# Patient Record
Sex: Male | Born: 1980
Health system: Southern US, Community
[De-identification: ages and names within clinical notes are randomized; demographics above are authoritative.]

## PROBLEM LIST (undated history)

## (undated) DIAGNOSIS — J45909 Unspecified asthma, uncomplicated: Secondary | ICD-10-CM

## (undated) DIAGNOSIS — M1990 Unspecified osteoarthritis, unspecified site: Secondary | ICD-10-CM

## (undated) DIAGNOSIS — T7840XA Allergy, unspecified, initial encounter: Secondary | ICD-10-CM

## (undated) HISTORY — PX: HEMORRHOID SURGERY: SHX153

## (undated) HISTORY — PX: BAND HEMORRHOIDECTOMY: SHX1213

## (undated) HISTORY — DX: Allergy, unspecified, initial encounter: T78.40XA

## (undated) HISTORY — DX: Unspecified asthma, uncomplicated: J45.909

## (undated) HISTORY — DX: Unspecified osteoarthritis, unspecified site: M19.90

## (undated) HISTORY — PX: SPINE SURGERY: SHX786

## (undated) HISTORY — PX: PILONIDAL CYST EXCISION: SHX744

---

## 2003-05-11 HISTORY — PX: REPLACEMENT DISC ANTERIOR LUMBAR SPINE: SUR1215

## 2004-06-06 ENCOUNTER — Emergency Department: Payer: Self-pay | Admitting: Emergency Medicine

## 2009-09-07 ENCOUNTER — Emergency Department: Payer: Self-pay | Admitting: Emergency Medicine

## 2013-08-23 ENCOUNTER — Ambulatory Visit: Payer: Self-pay | Admitting: Family Medicine

## 2016-02-04 ENCOUNTER — Ambulatory Visit (INDEPENDENT_AMBULATORY_CARE_PROVIDER_SITE_OTHER): Payer: 59 | Admitting: Family Medicine

## 2016-02-04 ENCOUNTER — Encounter: Payer: Self-pay | Admitting: Family Medicine

## 2016-02-04 VITALS — BP 134/82 | HR 83 | Temp 98.0°F | Resp 16 | Ht 70.0 in | Wt 213.6 lb

## 2016-02-04 DIAGNOSIS — T148XXA Other injury of unspecified body region, initial encounter: Secondary | ICD-10-CM

## 2016-02-04 DIAGNOSIS — J4520 Mild intermittent asthma, uncomplicated: Secondary | ICD-10-CM

## 2016-02-04 DIAGNOSIS — J302 Other seasonal allergic rhinitis: Secondary | ICD-10-CM | POA: Diagnosis not present

## 2016-02-04 DIAGNOSIS — T148 Other injury of unspecified body region: Secondary | ICD-10-CM

## 2016-02-04 DIAGNOSIS — Z91018 Allergy to other foods: Secondary | ICD-10-CM | POA: Insufficient documentation

## 2016-02-04 MED ORDER — ALBUTEROL SULFATE HFA 108 (90 BASE) MCG/ACT IN AERS: 2.0000 | INHALATION_SPRAY | Freq: Four times a day (QID) | RESPIRATORY_TRACT | 0 refills | Status: DC | PRN

## 2016-02-04 MED ORDER — LORATADINE 10 MG PO TABS: 10.0000 mg | ORAL_TABLET | Freq: Every day | ORAL | 11 refills | Status: DC

## 2016-02-04 NOTE — Progress Notes (Signed)
Name: Aaron Atkins   MRN: [id number]    DOB: 04-29-1981   Date:02/04/2016       Progress Note  Subjective  Chief Complaint  Chief Complaint  Patient presents with  . Fall    Patient was in [place] on January 30, 2016 and was walking down some steps that were wet. He fell but caught him him self and now has developed a big contusions on his right buttock and thigh. Patient states it hurts when sleeping and sitting for periods of time and throbbs.     HPI  Asthma mild intermittent: doing well, used Proair last a few weeks ago because he was hiking, otherwise doing well, no wheezing or cough. Seems to be triggered by exercise and increase in humidity  AR: he is doing well at this time, worse symptoms in Spring and mid - Fall, takes otc loratadine to control symptoms. No sneezing, rhinorrhea or nasal congestion  Fall/hematoma: he fell while on vacation in [place] 6 days ago, he fell down some slippery steps and hit his right buttocks and outer hip on concrete. He developed some pain but states that he is worried because hematoma seems to be getting larger instead of smaller, pain is controlled with Aleve, but pain is not getting progressively worse. He never hit his head. He denies change in bowel movements, hematuria or dysuria   Patient Active Problem List   Diagnosis Date Noted  . Nut allergy 02/04/2016  . Asthma, mild intermittent 02/04/2016  . Seasonal allergies 02/04/2016    Past Surgical History:  Procedure Laterality Date  . PILONIDAL CYST EXCISION  2002-2004  . REPLACEMENT DISC ANTERIOR LUMBAR SPINE  2005   Charte Disc Replacement  . SPINE SURGERY      Family History  Problem Relation Age of Onset  . Asthma Mother   . Diabetes Maternal Grandfather   . Stroke Maternal Grandfather   . Arthritis Maternal Grandfather     Social History   Social History  . Marital status: Single    Spouse name: N/A  . Number of children: N/A  . Years of education: N/A   Occupational  History  . Not on file.   Social History Main Topics  . Smoking status: Never Smoker  . Smokeless tobacco: Never Used  . Alcohol use Yes     Comment: ocassionally  . Drug use: No  . Sexual activity: No   Other Topics Concern  . Not on file   Social History Narrative  . No narrative on file     Current Outpatient Prescriptions:  .  EPINEPHrine 0.3 mg/0.3 mL IJ SOAJ injection, Inject 0.3 mg into the muscle once., Disp: , Rfl:  .  albuterol (PROAIR HFA) 108 (90 Base) MCG/ACT inhaler, Inhale 2 puffs into the lungs every 6 (six) hours as needed for wheezing or shortness of breath., Disp: 1 Inhaler, Rfl: 0 .  loratadine (CLARITIN) 10 MG tablet, Take 1 tablet (10 mg total) by mouth daily., Disp: 30 tablet, Rfl: 11  Allergies  Allergen Reactions  . Latex Itching and Swelling  . Peanut-Containing Drug Products Hives  . Tree Extract Hives  . Shellfish Allergy Rash     ROS  Ten systems reviewed and is negative except as mentioned in HPI   Objective  Vitals:   02/04/16 1028  BP: 134/82  Pulse: 83  Resp: 16  Temp: 98 F (36.7 C)  TempSrc: Oral  SpO2: 98%  Weight: 213 lb 9.6 oz (96.9 kg)  Height:  5\' 10"  (1.778 m)    Body mass index is 30.65 kg/m.  Physical Exam  Constitutional: Patient appears well-developed and well-nourished.No distress.  HEENT: head atraumatic, normocephalic, pupils equal and reactive to light, , neck supple, throat within normal limits Cardiovascular: Normal rate, regular rhythm and normal heart sounds.  No murmur heard. No BLE edema. Pulmonary/Chest: Effort normal and breath sounds normal. No respiratory distress. Abdominal: Soft.  There is no tenderness. Psychiatric: Patient has a normal mood and affect. behavior is normal. Judgment and thought content normal. Skin: large hematoma on right lower back, right outer hip and buttocks, small area of induration on right lower back, likely the spot of original hematoma  PHQ2/9: Depression screen [facility]  2/9 02/04/2016  Decreased Interest 0  Down, Depressed, Hopeless 0  PHQ - 2 Score 0     Fall Risk: Fall Risk  02/04/2016  Falls in the past year? Yes  Number falls in past yr: 1  Injury with Fall? Yes     Functional Status Survey: Is the patient deaf or have difficulty hearing?: No Does the patient have difficulty seeing, even when wearing glasses/contacts?: No Does the patient have difficulty concentrating, remembering, or making decisions?: No Does the patient have difficulty walking or climbing stairs?: No Does the patient have difficulty dressing or bathing?: No Does the patient have difficulty doing errands alone such as visiting a doctor's office or shopping?: No   Assessment & Plan  1. Asthma, mild intermittent, uncomplicated  - albuterol (PROAIR HFA) 108 (90 Base) MCG/ACT inhaler; Inhale 2 puffs into the lungs every 6 (six) hours as needed for wheezing or shortness of breath.  Dispense: 1 Inhaler; Refill: 0  2. Seasonal allergies  - loratadine (CLARITIN) 10 MG tablet; Take 1 tablet (10 mg total) by mouth daily.  Dispense: 30 tablet; Refill: 11  3. Hematoma and contusion  Warm compresses and reassurance, continue NSAI's

## 2016-09-27 ENCOUNTER — Ambulatory Visit (INDEPENDENT_AMBULATORY_CARE_PROVIDER_SITE_OTHER): Payer: 59 | Admitting: Family Medicine

## 2016-09-27 ENCOUNTER — Encounter: Payer: Self-pay | Admitting: Family Medicine

## 2016-09-27 VITALS — BP 116/82 | HR 107 | Temp 98.2°F | Resp 16 | Wt 202.4 lb

## 2016-09-27 DIAGNOSIS — R748 Abnormal levels of other serum enzymes: Secondary | ICD-10-CM

## 2016-09-27 DIAGNOSIS — E663 Overweight: Secondary | ICD-10-CM

## 2016-09-27 DIAGNOSIS — J4520 Mild intermittent asthma, uncomplicated: Secondary | ICD-10-CM

## 2016-09-27 DIAGNOSIS — L989 Disorder of the skin and subcutaneous tissue, unspecified: Secondary | ICD-10-CM | POA: Diagnosis not present

## 2016-09-27 DIAGNOSIS — B351 Tinea unguium: Secondary | ICD-10-CM | POA: Diagnosis not present

## 2016-09-27 LAB — CBC WITH DIFFERENTIAL/PLATELET
BASOS ABS: 0 {cells}/uL (ref 0–200)
Basophils Relative: 0 %
EOS ABS: 87 {cells}/uL (ref 15–500)
EOS PCT: 1 %
HCT: 48.4 % (ref 38.5–50.0)
Hemoglobin: 16.3 g/dL (ref 13.2–17.1)
LYMPHS ABS: 1392 {cells}/uL (ref 850–3900)
Lymphocytes Relative: 16 %
MCH: 31.3 pg (ref 27.0–33.0)
MCHC: 33.7 g/dL (ref 32.0–36.0)
MCV: 93.1 fL (ref 80.0–100.0)
MONOS PCT: 7 %
MPV: 10.3 fL (ref 7.5–12.5)
Monocytes Absolute: 609 cells/uL (ref 200–950)
NEUTROS ABS: 6612 {cells}/uL (ref 1500–7800)
Neutrophils Relative %: 76 %
PLATELETS: 285 10*3/uL (ref 140–400)
RBC: 5.2 MIL/uL (ref 4.20–5.80)
RDW: 13.1 % (ref 11.0–15.0)
WBC: 8.7 10*3/uL (ref 3.8–10.8)

## 2016-09-27 NOTE — Progress Notes (Signed)
Name: Aaron Atkins   MRN: [id number]    DOB: 05/16/1980   Date:09/27/2016       Progress Note  Subjective  Chief Complaint  Chief Complaint  Patient presents with  . Elevated Hepatic Enzymes    per [facility]  . skin tag    rt side of his head  in hair he stated that it feels as if it has gotten larger and he would like it removed                                        HPI  Onychomycosis: he went to Dr. Cleda Mccreedy for onychomycosis and AST and ALT were checked, one of the levels was only a few points above normal. He denies any nausea, vomiting or abdominal pain. He drinks alcohol not every day, occasionally he drinks heavily ( [place] He was given Lamisil but never started taking it .   Asthma mild intermittent: he states he has been doing very well, only has occasional wheezing or SOB with activity, only exercise induced, and still has Proair at home  Skin lesion: he has noticed a lesion on his nuchal area, present for many years, however got a new hair cut and he has been picking on it since, bleeding occasionally.    Patient Active Problem List   Diagnosis Date Noted  . Onychomycosis of toenail 09/27/2016  . Nut allergy 02/04/2016  . Asthma, mild intermittent 02/04/2016  . Seasonal allergies 02/04/2016    Past Surgical History:  Procedure Laterality Date  . PILONIDAL CYST EXCISION  2002-2004  . REPLACEMENT DISC ANTERIOR LUMBAR SPINE  2005   Charte Disc Replacement  . SPINE SURGERY      Family History  Problem Relation Age of Onset  . Asthma Mother   . Diabetes Maternal Grandfather   . Stroke Maternal Grandfather   . Arthritis Maternal Grandfather     Social History   Social History  . Marital status: Single    Spouse name: N/A  . Number of children: N/A  . Years of education: N/A   Occupational History  . Not on file.   Social History Main Topics  . Smoking status: Never Smoker  . Smokeless tobacco: Never  Used  . Alcohol use Yes     Comment: ocassionally  . Drug use: No  . Sexual activity: No   Other Topics Concern  . Not on file   Social History Narrative  . No narrative on file     Current Outpatient Prescriptions:  .  albuterol (PROAIR HFA) 108 (90 Base) MCG/ACT inhaler, Inhale 2 puffs into the lungs every 6 (six) hours as needed for wheezing or shortness of breath., Disp: 1 Inhaler, Rfl: 0 .  EPINEPHrine 0.3 mg/0.3 mL IJ SOAJ injection, Inject 0.3 mg into the muscle once., Disp: , Rfl:  .  loratadine (CLARITIN) 10 MG tablet, Take 1 tablet (10 mg total) by mouth daily., Disp: 30 tablet, Rfl: 11  Allergies  Allergen Reactions  . Latex Itching and Swelling  . Peanut-Containing Drug Products Hives  . Tree Extract Hives  . Shellfish Allergy Rash     ROS  Constitutional: Negative for fever or weight change.  Respiratory: Negative for cough and shortness of breath.   Cardiovascular: Negative for chest pain or palpitations.  Gastrointestinal: Negative for abdominal pain, no bowel  changes.  Musculoskeletal: Negative for gait problem or joint swelling.  Skin: Negative for rash.  Neurological: Negative for dizziness or headache.  No other specific complaints in a complete review of systems (except as listed in HPI above).  Objective  Vitals:   09/27/16 1524  BP: 116/82  Pulse: (!) 107  Resp: 16  Temp: 98.2 F (36.8 C)  SpO2: 96%  Weight: 202 lb 6 oz (91.8 kg)    Body mass index is 29.04 kg/m.  Physical Exam  Constitutional: Patient appears well-developed and well-nourished. Overweight. No distress.  HEENT: head atraumatic, normocephalic, pupils equal and reactive to light, neck supple, throat within normal limits Cardiovascular: Normal rate, regular rhythm and normal heart sounds.  No murmur heard. No BLE edema. Pulmonary/Chest: Effort normal and breath sounds normal. No respiratory distress. Abdominal: Soft.  There is no tenderness. Skin: irritated, warty like  lesion on scalp , and a smaller one on the same way ( right nuchal area)  Psychiatric: Patient has a normal mood and affect. behavior is normal. Judgment and thought content normal.  PHQ2/9: Depression screen PHQ 2/9 02/04/2016  Decreased Interest 0  Down, Depressed, Hopeless 0  PHQ - 2 Score 0    Fall Risk: Fall Risk  02/04/2016  Falls in the past year? Yes  Number falls in past yr: 1  Injury with Fall? Yes     Assessment & Plan  1. Mild intermittent asthma without complication  Doing well, usually only exercise induced  2. Skin lesion  - Ambulatory referral to Dermatology  3. Elevated liver enzymes  - COMPLETE METABOLIC PANEL WITH GFR - CBC with Differential/Platelet - Hepatitis, Acute  He was [place] this past [place] and just arrived last two days ago  4. Onychomycosis of toenail  Seen by Dr. Cleda Mccreedy and was given rx of Lamisil, but never started because liver enzymes were elevated  5. Overweight (BMI 25.0-29.9)  - COMPLETE METABOLIC PANEL WITH GFR - Hemoglobin A1c - Insulin, fasting

## 2016-09-28 LAB — COMPLETE METABOLIC PANEL WITH GFR
ALT: 68 U/L — ABNORMAL HIGH (ref 9–46)
AST: 32 U/L (ref 10–40)
Albumin: 4.7 g/dL (ref 3.6–5.1)
Alkaline Phosphatase: 54 U/L (ref 40–115)
BILIRUBIN TOTAL: 0.7 mg/dL (ref 0.2–1.2)
BUN: 13 mg/dL (ref 7–25)
CO2: 28 mmol/L (ref 20–31)
Calcium: 10 mg/dL (ref 8.6–10.3)
Chloride: 102 mmol/L (ref 98–110)
Creat: 1.12 mg/dL (ref 0.60–1.35)
GFR, EST NON AFRICAN AMERICAN: 85 mL/min (ref >=60)
GFR, Est African American: >89 mL/min (ref >=60)
GLUCOSE: 81 mg/dL (ref 65–99)
POTASSIUM: 4.3 mmol/L (ref 3.5–5.3)
SODIUM: 139 mmol/L (ref 135–146)
TOTAL PROTEIN: 7.4 g/dL (ref 6.1–8.1)

## 2016-09-28 LAB — HEPATITIS PANEL, ACUTE
HCV AB: NEGATIVE
HEP A IGM: NONREACTIVE
Hep B C IgM: NONREACTIVE
Hepatitis B Surface Ag: NEGATIVE

## 2016-09-28 LAB — INSULIN, FASTING: Insulin fasting, serum: 6.7 u[IU]/mL (ref 2.0–19.6)

## 2016-09-28 LAB — HEMOGLOBIN A1C
HEMOGLOBIN A1C: 5.3 % (ref <5.7)
Mean Plasma Glucose: 105 mg/dL

## 2016-09-29 ENCOUNTER — Telehealth: Payer: Self-pay

## 2016-09-29 NOTE — Telephone Encounter (Signed)
Patient called asking about Lab results, can you please review. Thanks.

## 2018-02-06 ENCOUNTER — Encounter: Payer: Self-pay | Admitting: Medical

## 2018-02-06 ENCOUNTER — Ambulatory Visit: Payer: Self-pay | Admitting: Medical

## 2018-02-06 VITALS — BP 125/82 | HR 82 | Temp 98.0°F | Resp 16 | Wt 206.6 lb

## 2018-02-06 DIAGNOSIS — M545 Low back pain, unspecified: Secondary | ICD-10-CM

## 2018-02-06 MED ORDER — PREDNISONE 10 MG (21) PO TBPK: ORAL_TABLET | ORAL | 0 refills | Status: DC

## 2018-02-06 NOTE — Progress Notes (Signed)
   Subjective:    Patient ID: Aaron Atkins, male    DOB: 20-Dec-1980, 37 y.o.   MRN: [id number]  HPI 37 yo in non acute distress. Low back pain x one month. Worse with movement. History of massage 12/23/17 in airport of [place]. Pain began the next day with low back pain.  Does not have [employer]. Took Ultracet for pain this weekend which helped his pain.  Took 2 Aleve this morning. Charte fusion in 2005, at L5-S1 in [place]. Pain seems that it is getting worse. No numbness or tingling in legs, no leg weakness, no saddle  Numbness or tinging.  [facility] is his primary doctor 2 years ago fell on vacation in [place] slippery surface, and fell. Swelling upper right buttock wondering what it is and if it can be fixed, no pain..  Review of Systems  HENT: Positive for congestion (seasonal allergies taking Alavert) and sore throat (sometimes with seasonal allergies).   Respiratory: Negative for cough and shortness of breath.   Cardiovascular: Negative for chest pain, palpitations and leg swelling.  Gastrointestinal: Negative for abdominal pain.  Endocrine: Negative for polydipsia, polyphagia and polyuria.  Genitourinary: Negative for difficulty urinating and dysuria.  Musculoskeletal: Positive for back pain and neck pain (gets it with the back pin).  Allergic/Immunologic: Positive for environmental allergies and food allergies (lactose intolerant, ).  Neurological: Positive for dizziness (one hour ago lasting  2 seconds after stanfing up .). Negative for syncope and light-headedness.       Objective:   Physical Exam  Constitutional: He is oriented to person, place, and time. He appears well-developed and well-nourished.  HENT:  Head: Normocephalic and atraumatic.  Right Ear: External ear normal.  Left Ear: External ear normal.  Eyes: Pupils are equal, round, and reactive to light. Conjunctivae and EOM are normal.  Neck: Normal range of motion. Neck supple.  Cardiovascular:  Normal rate, regular rhythm and normal heart sounds.  Pulmonary/Chest: Effort normal and breath sounds normal.  Neurological: He is alert and oriented to person, place, and time.  Skin: Skin is warm and dry.  Psychiatric: He has a normal mood and affect. His behavior is normal. Judgment and thought content normal.  Nursing note and vitals reviewed.   Sitting on exam table comfortably, easily gets off the exam table, gait steady and sure. 2 + popliteal reflexes. Negative straight leg raise though he can feel it with the right leg completely extended.     Patient shows swollen area above right  upper buttock x 2 years since he fell see HPI, not painful. Assessment & Plan:  Low back pain Needs x-ray and then most likely MRI.  To contact his primary doctor for appointment and  ordering tests. To take prednisone with food and may take Tylenol for pain but  not Ibuprofen with the prednisone. Declines muscle relaxers. Meds ordered this encounter  Medications  . predniSONE (STERAPRED UNI-PAK 21 TAB) 10 MG (21) TBPK tablet    Sig: Take 6 tablets by mouth today then  5 tablets tomorrow then one less tablet each day thereafter.    Dispense:  21 tablet    Refill:  0  Patient verbalizes understanding and has no questions at discharge.

## 2018-02-07 ENCOUNTER — Encounter: Payer: Self-pay | Admitting: Nurse Practitioner

## 2018-02-07 ENCOUNTER — Ambulatory Visit (INDEPENDENT_AMBULATORY_CARE_PROVIDER_SITE_OTHER): Payer: 59 | Admitting: Nurse Practitioner

## 2018-02-07 VITALS — BP 100/80 | HR 70 | Temp 97.8°F | Resp 16 | Ht 70.0 in | Wt 206.4 lb

## 2018-02-07 DIAGNOSIS — M5431 Sciatica, right side: Secondary | ICD-10-CM

## 2018-02-07 MED ORDER — TIZANIDINE HCL 2 MG PO CAPS: 2.0000 mg | ORAL_CAPSULE | Freq: Three times a day (TID) | ORAL | 0 refills | Status: DC | PRN

## 2018-02-07 NOTE — Patient Instructions (Addendum)
- Use prednisone taper - Take muscle relaxer one hour before trying stretching; use heat prior to stretching and ice afterwards  - Youtube [employer] videos for sciatic nerve pain and piriformis muscle stretches - If pain is unimproved in 2 weeks or worsening at any time can return here or [facility] walk-in clinic for re-evaluation  Spondylolisthesis Rehab Ask your health care provider which exercises are safe for you. Do exercises exactly as told by your health care provider and adjust them as directed. It is normal to feel mild stretching, pulling, tightness, or discomfort as you do these exercises, but you should stop right away if you feel sudden pain or your pain gets worse. Do not begin these exercises until told by your health care provider. Stretching and range of motion exercises These exercises warm up your muscles and joints and improve the movement and flexibility of your hips and your back. These exercises may also help to relieve pain, numbness, and tingling. Exercise A: Single knee to chest  1. Lie on your back on a firm surface with both legs straight. 2. Bend one of your knees. Use your hands to move your knee up toward your chest until you feel a gentle stretch in your lower back and buttock. ? Hold your leg in this position by holding onto the front of your knee. ? Keep your other leg as straight as possible. 3. Hold for __________ seconds. 4. Slowly return to the starting position. 5. Repeat this exercise with your other leg. Repeat __________ times. Complete this exercise __________ times a day. Exercise B: Double knee to chest  1. Lie on your back on a firm surface with both legs straight. 2. Bend one of your knees and move it toward your chest until you feel a gentle stretch in your lower back and buttock. 3. Tense your abdominal muscles and repeat the previous step with your other leg. 4. Hold both of your legs in this position by holding onto the backs of your thighs  or the fronts of your knees. 5. Hold for __________ seconds. 6. Tense your abdominal muscles and slowly move your legs back to the floor, one leg at a time. Repeat __________ times. Complete this exercise __________ times a day. Strengthening exercises These exercises build strength and endurance in your back. Endurance is the ability to use your muscles for a long time, even after they get tired. Exercise C: Pelvic tilt 1. Lie on your back on a firm bed or the floor. Bend your knees and keep your feet flat. 2. Tense your abdominal muscles. Tip your pelvis up toward the ceiling and flatten your lower back into the floor. ? To help with this exercise, you may place a small towel under your lower back and try to push your back into the towel. 3. Hold for __________ seconds. 4. Let your muscles relax completely before you repeat this exercise. Repeat __________ times. Complete this exercise __________ times a day. Exercise D: Abdominal crunch  1. Lie on your back on a firm surface. Bend your knees and keep your feet flat. Cross your arms over your chest. 2. Tuck your chin down toward your chest, without bending your neck. 3. Use your abdominal muscles to lift your upper body off of the ground, straight up into the air. ? Try to lift yourself until your shoulder blades are off the ground. You may need to work up to this. ? Keep your lower back on the ground while you crunch upward. ?  Do not hold your breath. 4. Slowly lower yourself down. Keep your abdominal muscles tense until you are back to the starting position. Repeat __________ times. Complete this exercise __________ times a day. Exercise E: Alternating arm and leg raises  1. Get on your hands and knees on a firm surface. If you are on a hard floor, you may want to use padding to cushion your knees, such as an exercise mat. 2. Line up your arms and legs. Your hands should be below your shoulders, and your knees should be below your  hips. 3. Lift your left leg behind you. At the same time, raise your right arm and straighten it in front of you. ? Do not lift your leg higher than your hip. ? Do not lift your arm higher than your shoulder. ? Keep your abdominal and back muscles tight. ? Keep your hips facing the ground. ? Do not arch your back. ? Keep your balance carefully, and do not hold your breath. 4. Hold for __________ seconds. 5. Slowly return to the starting position and repeat with your right leg and your left arm. Repeat __________ times. Complete this exercise __________ times a day. Posture and body mechanics  Body mechanics refers to the movements and positions of your body while you do your daily activities. Posture is part of body mechanics. Good posture and healthy body mechanics can help to relieve stress in your body's tissues and joints. Good posture means that your spine is in its natural S-curve position (your spine is neutral), your shoulders are pulled back slightly, and your head is not tipped forward. The following are general guidelines for applying improved posture and body mechanics to your everyday activities. Standing   When standing, keep your spine neutral and your feet about hip-width apart. Keep a slight bend in your knees. Your ears, shoulders, and hips should line up.  When you do a task in which you stand in one place for a long time, place one foot up on a stable object that is 2-4 inches (5-10 cm) high, such as a footstool. This helps keep your spine neutral. Sitting   When sitting, keep your spine neutral and keep your feet flat on the floor. Use a footrest, if necessary, and keep your thighs parallel to the floor. Avoid rounding your shoulders, and avoid tilting your head forward.  When working at a desk or a computer, keep your desk at a height where your hands are slightly lower than your elbows. Slide your chair under your desk so you are close enough to maintain good  posture.  When working at a computer, place your monitor at a height where you are looking straight ahead and you do not have to tilt your head forward or downward to look at the screen. Resting  When lying down and resting, avoid positions that are most painful for you.  If you have pain with activities such as sitting, bending, stooping, or squatting (flexion-based activities), lie in a position in which your body does not bend very much. For example, avoid curling up on your side with your arms and knees near your chest (fetal position).  If you have pain with activities such as standing for a long time or reaching with your arms (extension-based activities), lie with your spine in a neutral position and bend your knees slightly. Try the following positions: ? Lying on your side with a pillow between your knees. ? Lying on your back with a pillow under  your knees.  Lifting   When lifting objects, keep your feet at least shoulder-width apart and tighten your abdominal muscles.  Bend your knees and hips and keep your spine neutral. It is important to lift using the strength of your legs, not your back. Do not lock your knees straight out.  Always ask for help to lift heavy or awkward objects. This information is not intended to replace advice given to you by your health care provider. Make sure you discuss any questions you have with your health care provider. Document Released: 04/26/2005 Document Revised: 01/01/2016 Document Reviewed: 02/04/2015 Elsevier Interactive Patient Education  [employer].

## 2018-02-07 NOTE — Progress Notes (Signed)
Name: Aaron Atkins   MRN: [id number]    DOB: [birth year]   Date:02/07/2018       Progress Note  Subjective  Chief Complaint  Chief Complaint  Patient presents with  . Back Pain    HPI  Patient presents for right lower back pain ongoing for one month; states had massage one month ago and states had severe back pain after that. Pain is worse with movement- worse as the day progresses, and with bending and lifting. Improved with rest and aleve. Has old rx for tramadol needed that on Saturday. Denies paresthesias, bladder or bowel incontinence, hematuria.  Had disc replacement in 2005  Patient Active Problem List   Diagnosis Date Noted  . Onychomycosis of toenail 09/27/2016  . Nut allergy 02/04/2016  . Asthma, mild intermittent 02/04/2016  . Seasonal allergies 02/04/2016    Past Medical History:  Diagnosis Date  . Allergy   . Arthritis   . Asthma     Past Surgical History:  Procedure Laterality Date  . PILONIDAL CYST EXCISION  2002-2004  . REPLACEMENT DISC ANTERIOR LUMBAR SPINE  2005   Charte Disc Replacement  . SPINE SURGERY      Social History   Tobacco Use  . Smoking status: Never Smoker  . Smokeless tobacco: Never Used  Substance Use Topics  . Alcohol use: Yes    Comment: ocassionally     Current Outpatient Medications:  .  albuterol (PROAIR HFA) 108 (90 Base) MCG/ACT inhaler, Inhale 2 puffs into the lungs every 6 (six) hours as needed for wheezing or shortness of breath., Disp: 1 Inhaler, Rfl: 0 .  EPINEPHrine 0.3 mg/0.3 mL IJ SOAJ injection, Inject 0.3 mg into the muscle once as needed (Allergic reaction). , Disp: , Rfl:  .  loratadine (CLARITIN) 10 MG tablet, Take 1 tablet (10 mg total) by mouth daily., Disp: 30 tablet, Rfl: 11 .  naproxen sodium (ALEVE) 220 MG tablet, Take 220 mg by mouth., Disp: , Rfl:  .  predniSONE (STERAPRED UNI-PAK 21 TAB) 10 MG (21) TBPK tablet, Take 6 tablets by mouth today then  5 tablets tomorrow then one less tablet each day  thereafter., Disp: 21 tablet, Rfl: 0  Allergies  Allergen Reactions  . Latex Itching and Swelling  . Other     Kiwi, bananas and avacados, stabbing pain in stomach  . Peanut-Containing Drug Products Hives  . Tree Extract Hives  . Shellfish Allergy Rash    ROS   No other specific complaints in a complete review of systems (except as listed in HPI above).  Objective  Vitals:   02/07/18 0938  BP: 100/80  Pulse: 70  Resp: 16  Temp: 97.8 F (36.6 C)  TempSrc: Oral  SpO2: 99%  Weight: 206 lb 6.4 oz (93.6 kg)  Height: 5\' 10"  (1.778 m)    Body mass index is 29.62 kg/m.  Nursing Note and Vital Signs reviewed.  Physical Exam  Constitutional: He is oriented to person, place, and time. He appears well-developed and well-nourished.  HENT:  Head: Normocephalic and atraumatic.  Eyes: Conjunctivae are normal.  Cardiovascular: Normal rate.  Pulmonary/Chest: Effort normal.  Musculoskeletal:       Lumbar back: Normal. He exhibits normal range of motion and no tenderness.       Back:  Neurological: He is alert and oriented to person, place, and time. No sensory deficit. He exhibits normal muscle tone.  Skin: Skin is warm and dry. No rash noted.  Psychiatric: He has a normal  mood and affect. His behavior is normal. Judgment and thought content normal.       No results found for this or any previous visit (from the past 48 hour(s)).  Assessment & Plan  1. Sciatica, right side - Use prednisone taper - Take muscle relaxer one hour before trying stretching; use heat prior to stretching and ice afterwards  - Youtube [employer] videos for sciatic nerve pain and piriformis muscle stretches - If pain is unimproved in 2 weeks or worsening at any time can return here or [facility] walk-in clinic for re-evaluation - tizanidine (ZANAFLEX) 2 MG capsule; Take 1 capsule (2 mg total) by mouth 3 (three) times daily as needed for muscle spasms.  Dispense: 30 capsule; Refill: 0

## 2018-02-08 ENCOUNTER — Ambulatory Visit: Payer: 59 | Admitting: Family Medicine

## 2018-08-16 ENCOUNTER — Other Ambulatory Visit: Payer: Self-pay

## 2018-08-16 ENCOUNTER — Emergency Department
Admission: EM | Admit: 2018-08-16 | Discharge: 2018-08-16 | Disposition: A | Discharge status: Home / Self Care | Payer: 59 | Attending: Emergency Medicine | Admitting: Emergency Medicine

## 2018-08-16 ENCOUNTER — Emergency Department: Payer: 59

## 2018-08-16 ENCOUNTER — Encounter: Payer: Self-pay | Admitting: *Deleted

## 2018-08-16 ENCOUNTER — Telehealth: Payer: Self-pay

## 2018-08-16 ENCOUNTER — Ambulatory Visit: Payer: Self-pay

## 2018-08-16 DIAGNOSIS — S8991XA Unspecified injury of right lower leg, initial encounter: Secondary | ICD-10-CM | POA: Diagnosis present

## 2018-08-16 DIAGNOSIS — J45909 Unspecified asthma, uncomplicated: Secondary | ICD-10-CM | POA: Diagnosis not present

## 2018-08-16 DIAGNOSIS — S060X1A Concussion with loss of consciousness of 30 minutes or less, initial encounter: Secondary | ICD-10-CM

## 2018-08-16 DIAGNOSIS — Z79899 Other long term (current) drug therapy: Secondary | ICD-10-CM | POA: Insufficient documentation

## 2018-08-16 DIAGNOSIS — Y999 Unspecified external cause status: Secondary | ICD-10-CM | POA: Diagnosis not present

## 2018-08-16 DIAGNOSIS — R0781 Pleurodynia: Secondary | ICD-10-CM | POA: Diagnosis not present

## 2018-08-16 DIAGNOSIS — Z91010 Allergy to peanuts: Secondary | ICD-10-CM | POA: Insufficient documentation

## 2018-08-16 DIAGNOSIS — S80211A Abrasion, right knee, initial encounter: Secondary | ICD-10-CM | POA: Diagnosis not present

## 2018-08-16 DIAGNOSIS — Z91040 Latex allergy status: Secondary | ICD-10-CM | POA: Diagnosis not present

## 2018-08-16 DIAGNOSIS — Y929 Unspecified place or not applicable: Secondary | ICD-10-CM | POA: Diagnosis not present

## 2018-08-16 DIAGNOSIS — Y9389 Activity, other specified: Secondary | ICD-10-CM | POA: Insufficient documentation

## 2018-08-16 MED ORDER — IBUPROFEN 800 MG PO TABS
800.0000 mg | ORAL_TABLET | ORAL | Status: AC
  Administered 2018-08-16: 800 mg via ORAL
  Filled 2018-08-16: qty 1
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
## 2018-08-17 ENCOUNTER — Ambulatory Visit (INDEPENDENT_AMBULATORY_CARE_PROVIDER_SITE_OTHER): Payer: Self-pay | Admitting: Family Medicine

## 2018-08-17 ENCOUNTER — Encounter: Payer: Self-pay | Admitting: Family Medicine

## 2018-08-17 DIAGNOSIS — R0781 Pleurodynia: Secondary | ICD-10-CM

## 2018-08-17 DIAGNOSIS — S060X1D Concussion with loss of consciousness of 30 minutes or less, subsequent encounter: Secondary | ICD-10-CM

## 2018-08-17 MED ORDER — AMITRIPTYLINE HCL 25 MG PO TABS: 25.0000 mg | ORAL_TABLET | Freq: Every day | ORAL | 1 refills | Status: DC
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
## 2018-08-17 NOTE — Patient Instructions (Signed)
Post-Concussion Syndrome    Post-concussion syndrome is when symptoms last longer than normal after a head injury.  What are the signs or symptoms?  After a head injury, you may:  · Have headaches.  · Feel tired.  · Feel dizzy.  · Feel weak.  · Have trouble seeing.  · Have trouble in bright lights.  · Have trouble hearing.  · Not be able to remember things.  · Not be able to focus.  · Have trouble sleeping.  · Have mood swings.  · Have trouble learning new things.  These can last from weeks to months.  Follow these instructions at home:  Medicines  · Take all medicines only as told by your doctor.  · Do not take prescription pain medicines.  Activity  · Limit activities as told by your doctor. This includes:  ? Homework.  ? Job-related work.  ? Thinking.  ? Watching TV.  ? Using a computer or phone.  ? Puzzles.  ? Exercise.  ? Sports.  · Slowly return to your normal activity as told by your doctor.  · Stop an activity if you have symptoms.  · Do not do anything that may cause you to get injured again.  General instructions  · Rest. Try to:  ? Sleep 7-9 hours each night.  ? Take naps or breaks when you feel tired during the day.  · Do not drink alcohol until your doctor says that you can.  · Keep track of your symptoms.  · Keep all follow-up visits as told by your doctor. This is important.  Contact a doctor if:  · You do not improve.  · You get worse.  · You have another injury.  Get help right away if:  · You have a very bad headache.  · You feel confused.  · You feel very sleepy.  · You pass out (faint).  · You throw up (vomit).  · You feel weak in any part of your body.  · You feel numb in any part of your body.  · You start shaking (have a seizure).  · You have trouble talking.  Summary  · Post-concussion syndrome is when symptoms last longer than normal after a head injury.  · Limit all activity after your injury. Gradually return to normal activity as told by your doctor.  · Rest, do not drink alcohol, and  avoid prescription pain medicines after a concussion.  · Call your doctor if your symptoms get worse.  This information is not intended to replace advice given to you by your health care provider. Make sure you discuss any questions you have with your health care provider.  Document Released: 06/03/2004 Document Revised: 05/31/2017 Document Reviewed: 05/31/2017  Elsevier Interactive Patient Education © 2019 Elsevier Inc.

## 2018-10-05 ENCOUNTER — Telehealth: Payer: Self-pay

## 2018-10-05 NOTE — Telephone Encounter (Signed)
Patient called requesting a referral to [facility]. We will make a referral for him.

## 2018-10-05 NOTE — Telephone Encounter (Signed)
After review of chart and noting patient went to ED 4/8 and follow up with PCP 4/9 for this it is advised he follow up with PCP for the referral to [facility].

## 2018-10-06 ENCOUNTER — Telehealth: Payer: Self-pay | Admitting: Family Medicine

## 2018-10-06 ENCOUNTER — Other Ambulatory Visit: Payer: Self-pay | Admitting: Family Medicine

## 2018-10-06 DIAGNOSIS — S060X1D Concussion with loss of consciousness of 30 minutes or less, subsequent encounter: Secondary | ICD-10-CM

## 2018-10-06 NOTE — Telephone Encounter (Signed)
Copied from [place]. Topic: Quick Communication - Rx Refill/Question >> Oct 06, 2018 12:36 PM Leward Quan A wrote: Medication: amitriptyline (ELAVIL) 25 MG tablet  Has the patient contacted their pharmacy? Yes.   (Agent: If no, request that the patient contact the pharmacy for the refill.) (Agent: If yes, when and what did the pharmacy advise?)  Preferred Pharmacy (with phone number or street name): [id number] [address], [place] [phone] (Phone) [phone] (Fax)    Agent: Please be advised that RX refills may take up to 3 business days. We ask that you follow-up with your pharmacy.

## 2018-10-06 NOTE — Telephone Encounter (Signed)
Rx is pending

## 2018-10-06 NOTE — Telephone Encounter (Signed)
Copied from [place] [id number]. Topic: Referral - Request for Referral >> Oct 06, 2018 12:38 PM Leward Quan A wrote: Has patient seen PCP for this complaint? Yes.   *If NO, is insurance requiring patient see PCP for this issue before PCP can refer them? Referral for which specialty: Neurology Preferred provider/office: [facility] Reason for referral: Issues from concussion

## 2018-10-13 ENCOUNTER — Encounter: Payer: Self-pay | Admitting: Family Medicine

## 2018-10-13 ENCOUNTER — Ambulatory Visit (INDEPENDENT_AMBULATORY_CARE_PROVIDER_SITE_OTHER): Payer: 59 | Admitting: Family Medicine

## 2018-10-13 DIAGNOSIS — F0781 Postconcussional syndrome: Secondary | ICD-10-CM | POA: Diagnosis not present
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
## 2018-10-13 NOTE — Patient Instructions (Signed)
Post-Concussion Syndrome    A concussion is a brain injury from a direct hit (blow) to your head or body. This blow causes your brain to shake quickly back and forth inside your skull. This can damage brain cells and cause chemical changes in your brain. Concussions are usually not life-threatening but can cause several serious symptoms.  Post-concussion syndrome is when symptoms that occur after a concussion last longer than normal. These symptoms can last from weeks to months.  What are the causes?  The cause of this condition is not known. It can happen whether your head injury was mild or severe.  What increases the risk?  You are more likely to develop this condition if:  · You are male.  · You are a child, teen, or young adult.  · You had a past head injury.  · You have a history of headaches.  · You have depression or anxiety.  What are the signs or symptoms?  Physical symptoms  · Headaches.  · Tiredness.  · Dizziness.  · Weakness.  · Blurry vision.  · Sensitivity to light.  · Hearing difficulties.  Mental and emotional symptoms  · Memory difficulties.  · Difficulty with concentration.  · Difficulty sleeping or staying asleep.  · Feeling irritable.  · Anxiety or depression.  · Difficulty learning new things.  How is this diagnosed?  This condition may be diagnosed based on:  · Your symptoms.  · A description of your injury.  · Your medical history.  Your health care provider may order other tests such as:  · Brain function tests (neurological testing).  · CT scan.  How is this treated?  Treatment for this condition may depend on your symptoms. Symptoms usually go away on their own over time. Treatments may include:  · Medicines for headaches.  · Resting your brain and body for a few days after your injury.  · Rehabilitation therapy, such as:  ? Physical or occupational therapy. This may include exercises to help with balance and dizziness.  ? Mental health counseling.  ? Speech therapy.  ? Vision therapy. A  brain and eye specialist can recommend treatments for vision problems.  Follow these instructions at home:  Medicines  · Take over-the-counter and prescription medicines only as told by your health care provider.  · Avoid opioid prescription pain medicines when recovering from a concussion.  Activity  · Limit your mental activities for the first few days after your injury, such as:  ? Homework or job-related work.  ? Complex thinking.  ? Watching TV, and using a computer or phone.  ? Playing memory games and puzzles.  ? Gradually return to your normal activity level. If a certain activity brings on your symptoms, stop or slow down until you can do the activity without it triggering your symptoms.  · Limit physical activity, such as exercise or sports, for the first few days after a concussion. Gradually return to normal activity as told by your health care provider.  ? If a certain activity brings on your symptoms, stop or slow down until you can do the activity without it triggering your symptoms.  · Rest. Rest helps your brain heal. Make sure you:  ? Get plenty of sleep at night. Most adults should get at least 7-9 hours of sleep each night.  ? Rest during the day. Take naps or rest breaks when you feel tired.  · Do not do high-risk activities that could cause a second concussion,   such as riding a bike or playing sports. Having another concussion before the first one has healed can be dangerous.  General instructions  · Do not drink alcohol until your health care provider says you can.  · Keep track of the frequency and the severity of your symptoms. Give this information to your health care provider.  · Keep all follow-up visits as directed by your health care provider. This is important.  Contact a health care provider if:  · Your symptoms do not improve.  · You have another injury.  Get help right away if you:  · Have a severe or worsening headache.  · Are confused.  · Have trouble staying awake.  · Pass  out.  · Vomit.  · Have weakness or numbness in any part of your body.  · Have a seizure.  · Have trouble speaking.  Summary  · Post-concussion syndrome is when symptoms that occur after a concussion last longer than normal.  · Symptoms usually go away on their own over time. Depending on your symptoms, you may need treatment, such as medicines or rehabilitation therapy.  · Rest your brain and body for a few days after your injury. Gradually return to activities, as told by your health care provider.  · Get plenty of sleep, and avoid alcohol and opioid pain medicines while recovering from a concussion.  This information is not intended to replace advice given to you by your health care provider. Make sure you discuss any questions you have with your health care provider.  Document Released: 10/16/2001 Document Revised: 05/31/2017 Document Reviewed: 05/31/2017  Elsevier Interactive Patient Education © 2019 Elsevier Inc.

## 2018-11-04 ENCOUNTER — Other Ambulatory Visit: Payer: Self-pay | Admitting: Family Medicine

## 2018-11-04 DIAGNOSIS — S060X1D Concussion with loss of consciousness of 30 minutes or less, subsequent encounter: Secondary | ICD-10-CM

## 2018-11-13 ENCOUNTER — Other Ambulatory Visit: Payer: Self-pay

## 2018-11-13 ENCOUNTER — Encounter: Payer: Self-pay | Admitting: Neurology

## 2018-11-13 ENCOUNTER — Ambulatory Visit (INDEPENDENT_AMBULATORY_CARE_PROVIDER_SITE_OTHER): Payer: 59 | Admitting: Neurology

## 2018-11-13 VITALS — BP 127/82 | HR 82 | Temp 97.1°F | Ht 70.0 in | Wt 209.0 lb

## 2018-11-13 DIAGNOSIS — X58XXXS Exposure to other specified factors, sequela: Secondary | ICD-10-CM

## 2018-11-13 DIAGNOSIS — R413 Other amnesia: Secondary | ICD-10-CM | POA: Diagnosis not present

## 2018-11-13 DIAGNOSIS — X58XXXA Exposure to other specified factors, initial encounter: Secondary | ICD-10-CM | POA: Insufficient documentation
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
## 2018-11-15 ENCOUNTER — Telehealth: Payer: Self-pay | Admitting: Neurology

## 2018-11-15 NOTE — Telephone Encounter (Signed)
[employer] auth: NPR Ref # [id number] on 11/15/18. Patient is scheduled at [facility] for 11/26/18 arrival time is 3:30 PM. Patient is aware of time & day. If he needed to r/s the phone number is [phone].

## 2018-11-17 LAB — COMPREHENSIVE METABOLIC PANEL
ALT: 86 IU/L — ABNORMAL HIGH (ref 0–44)
AST: 38 IU/L (ref 0–40)
Albumin/Globulin Ratio: 1.9 (ref 1.2–2.2)
Albumin: 4.8 g/dL (ref 4.0–5.0)
Alkaline Phosphatase: 60 IU/L (ref 39–117)
BUN/Creatinine Ratio: 15 (ref 9–20)
BUN: 18 mg/dL (ref 6–20)
Bilirubin Total: 0.4 mg/dL (ref 0.0–1.2)
CO2: 21 mmol/L (ref 20–29)
Calcium: 9.6 mg/dL (ref 8.7–10.2)
Chloride: 100 mmol/L (ref 96–106)
Creatinine, Ser: 1.17 mg/dL (ref 0.76–1.27)
GFR calc Af Amer: 92 mL/min/{1.73_m2} (ref >59)
GFR calc non Af Amer: 80 mL/min/{1.73_m2} (ref >59)
Globulin, Total: 2.5 g/dL (ref 1.5–4.5)
Glucose: 78 mg/dL (ref 65–99)
Potassium: 4.2 mmol/L (ref 3.5–5.2)
Sodium: 139 mmol/L (ref 134–144)
Total Protein: 7.3 g/dL (ref 6.0–8.5)

## 2018-11-17 LAB — CBC WITH DIFFERENTIAL
Basophils Absolute: 0 10*3/uL (ref 0.0–0.2)
Basos: 0 %
EOS (ABSOLUTE): 0.2 10*3/uL (ref 0.0–0.4)
Eos: 3 %
Hematocrit: 46.1 % (ref 37.5–51.0)
Hemoglobin: 15.5 g/dL (ref 13.0–17.7)
Immature Grans (Abs): 0 10*3/uL (ref 0.0–0.1)
Immature Granulocytes: 0 %
Lymphocytes Absolute: 1.9 10*3/uL (ref 0.7–3.1)
Lymphs: 25 %
MCH: 30.9 pg (ref 26.6–33.0)
MCHC: 33.6 g/dL (ref 31.5–35.7)
MCV: 92 fL (ref 79–97)
Monocytes Absolute: 0.5 10*3/uL (ref 0.1–0.9)
Monocytes: 7 %
Neutrophils Absolute: 4.9 10*3/uL (ref 1.4–7.0)
Neutrophils: 65 %
RBC: 5.02 x10E6/uL (ref 4.14–5.80)
RDW: 12 % (ref 11.6–15.4)
WBC: 7.5 10*3/uL (ref 3.4–10.8)

## 2018-11-17 LAB — VITAMIN B12: Vitamin B-12: 411 pg/mL (ref 232–1245)

## 2018-11-17 LAB — RPR: RPR Ser Ql: NONREACTIVE

## 2018-11-17 LAB — TSH: TSH: 1.85 u[IU]/mL (ref 0.450–4.500)

## 2018-11-26 ENCOUNTER — Other Ambulatory Visit: Payer: Self-pay

## 2018-11-26 ENCOUNTER — Ambulatory Visit
Admission: RE | Admit: 2018-11-26 | Discharge: 2018-11-26 | Disposition: A | Discharge status: Home / Self Care | Payer: 59 | Source: Ambulatory Visit | Attending: Neurology | Admitting: Neurology

## 2018-11-26 DIAGNOSIS — R413 Other amnesia: Secondary | ICD-10-CM

## 2018-12-06 ENCOUNTER — Other Ambulatory Visit: Payer: Self-pay | Admitting: Family Medicine

## 2018-12-06 DIAGNOSIS — S060X1D Concussion with loss of consciousness of 30 minutes or less, subsequent encounter: Secondary | ICD-10-CM

## 2018-12-07 MED ORDER — AMITRIPTYLINE HCL 25 MG PO TABS: 25.0000 mg | ORAL_TABLET | Freq: Every day | ORAL | 1 refills | Status: DC

## 2019-01-16 ENCOUNTER — Other Ambulatory Visit: Payer: Self-pay

## 2019-01-16 ENCOUNTER — Encounter: Payer: Self-pay | Admitting: Family Medicine

## 2019-01-16 ENCOUNTER — Ambulatory Visit (INDEPENDENT_AMBULATORY_CARE_PROVIDER_SITE_OTHER): Payer: 59 | Admitting: Family Medicine

## 2019-01-16 ENCOUNTER — Other Ambulatory Visit: Payer: Self-pay | Admitting: Family Medicine

## 2019-01-16 VITALS — BP 110/82 | HR 67 | Temp 97.5°F | Resp 16 | Ht 70.0 in | Wt 206.1 lb

## 2019-01-16 DIAGNOSIS — D229 Melanocytic nevi, unspecified: Secondary | ICD-10-CM

## 2019-01-16 DIAGNOSIS — F0781 Postconcussional syndrome: Secondary | ICD-10-CM

## 2019-01-16 DIAGNOSIS — Z0000 Encounter for general adult medical examination without abnormal findings: Secondary | ICD-10-CM

## 2019-01-16 MED ORDER — AMITRIPTYLINE HCL 25 MG PO TABS: 25.0000 mg | ORAL_TABLET | Freq: Every day | ORAL | 1 refills | Status: DC

## 2019-01-16 NOTE — Patient Instructions (Signed)
Preventive Care 19-36 Years Old, Male Preventive care refers to lifestyle choices and visits with your health care provider that can promote health and wellness. This includes:  A yearly physical exam. This is also called an annual well check.  Regular dental and eye exams.  Immunizations.  Screening for certain conditions.  Healthy lifestyle choices, such as eating a healthy diet, getting regular exercise, not using drugs or products that contain nicotine and tobacco, and limiting alcohol use. What can I expect for my preventive care visit? Physical exam Your health care provider will check:  Height and weight. These may be used to calculate body mass index (BMI), which is a measurement that tells if you are at a healthy weight.  Heart rate and blood pressure.  Your skin for abnormal spots. Counseling Your health care provider may ask you questions about:  Alcohol, tobacco, and drug use.  Emotional well-being.  Home and relationship well-being.  Sexual activity.  Eating habits.  Work and work Statistician. What immunizations do I need?  Influenza (flu) vaccine  This is recommended every year. Tetanus, diphtheria, and pertussis (Tdap) vaccine  You may need a Td booster every 10 years. Varicella (chickenpox) vaccine  You may need this vaccine if you have not already been vaccinated. Human papillomavirus (HPV) vaccine  If recommended by your health care provider, you may need three doses over 6 months. Measles, mumps, and rubella (MMR) vaccine  You may need at least one dose of MMR. You may also need a second dose. Meningococcal conjugate (MenACWY) vaccine  One dose is recommended if you are 45-76 years old and a Market researcher living in a residence hall, or if you have one of several medical conditions. You may also need additional booster doses. Pneumococcal conjugate (PCV13) vaccine  You may need this if you have certain conditions and were not  previously vaccinated. Pneumococcal polysaccharide (PPSV23) vaccine  You may need one or two doses if you smoke cigarettes or if you have certain conditions. Hepatitis A vaccine  You may need this if you have certain conditions or if you travel or work in places where you may be exposed to hepatitis A. Hepatitis B vaccine  You may need this if you have certain conditions or if you travel or work in places where you may be exposed to hepatitis B. Haemophilus influenzae type b (Hib) vaccine  You may need this if you have certain risk factors. You may receive vaccines as individual doses or as more than one vaccine together in one shot (combination vaccines). Talk with your health care provider about the risks and benefits of combination vaccines. What tests do I need? Blood tests  Lipid and cholesterol levels. These may be checked every 5 years starting at age 17.  Hepatitis C test.  Hepatitis B test. Screening   Diabetes screening. This is done by checking your blood sugar (glucose) after you have not eaten for a while (fasting).  Sexually transmitted disease (STD) testing. Talk with your health care provider about your test results, treatment options, and if necessary, the need for more tests. Follow these instructions at home: Eating and drinking   Eat a diet that includes fresh fruits and vegetables, whole grains, lean protein, and low-fat dairy products.  Take vitamin and mineral supplements as recommended by your health care provider.  Do not drink alcohol if your health care provider tells you not to drink.  If you drink alcohol: ? Limit how much you have to 0-2  drinks a day. ? Be aware of how much alcohol is in your drink. In the [place] one drink equals one 12 oz bottle of beer (355 mL), one 5 oz glass of wine (148 mL), or one 1 oz glass of hard liquor (44 mL). Lifestyle  Take daily care of your teeth and gums.  Stay active. Exercise for at least 30 minutes on 5 or  more days each week.  Do not use any products that contain nicotine or tobacco, such as cigarettes, e-cigarettes, and chewing tobacco. If you need help quitting, ask your health care provider.  If you are sexually active, practice safe sex. Use a condom or other form of protection to prevent STIs (sexually transmitted infections). What's next?  Go to your health care provider once a year for a well check visit.  Ask your health care provider how often you should have your eyes and teeth checked.  Stay up to date on all vaccines. This information is not intended to replace advice given to you by your health care provider. Make sure you discuss any questions you have with your health care provider. Document Released: 06/22/2001 Document Revised: 04/20/2018 Document Reviewed: 04/20/2018 Elsevier Patient Education  2020 Elsevier Inc.  

## 2019-01-16 NOTE — Progress Notes (Signed)
Name: Aaron Atkins   MRN: [id number]    DOB: 02/08/1981   Date:01/16/2019       Progress Note  Subjective  Chief Complaint  Chief Complaint  Patient presents with  . Annual Exam    HPI  Patient presents for annual CPE  He would also like to discuss concussion symptoms. Explained that it will be a separate charge  Concussion syndrome: he was seen by Neurologist. He is still taking Elavil, he is exhausted after work, he is sleeping about 10 hours but is not waking up feeling rested. Cannot multitask - such as walk and talk at the same time. 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 Mental activity tends to still cause headaches ( like grading students), also wakes up with headaches when he has a lot of dreams at night . He states blue light glasses seems to help with headaches, avoiding using cell phones/screen. He has tried to drink a little wine three times, but he states it makes him feel more tired/body does not feel good . He is not driving long distance yet, because driving causes fatigue. Junk food makes him feel worse  USPSTF grade A and B recommendations:  Diet: tries to eat healthy Exercise: biking and yoga, three times a week, nothing strenuous  since concussion  Depression: phq 9 is negative Depression screen [facility] 2/9 01/16/2019 10/13/2018 08/17/2018 02/07/2018 02/04/2016  Decreased Interest 0 0 0 0 0  Down, Depressed, Hopeless 0 0 0 0 0  PHQ - 2 Score 0 0 0 0 0  Altered sleeping 0 2 0 - -  Tired, decreased energy 0 2 0 - -  Change in appetite 0 1 0 - -  Feeling bad or failure about yourself  0 0 0 - -  Trouble concentrating 0 1 0 - -  Moving slowly or fidgety/restless 0 1 0 - -  Suicidal thoughts 0 0 0 - -  PHQ-9 Score 0 7 0 - -  Difficult doing work/chores - Somewhat difficult Not difficult at all - -    Hypertension:  BP Readings from Last 3 Encounters:  01/16/19  110/82  11/13/18 127/82  08/16/18 131/68    Obesity: Wt Readings from Last 3 Encounters:  01/16/19 206 lb 1.6 oz (93.5 kg)  11/26/18 209 lb (94.8 kg)  11/13/18 209 lb (94.8 kg)   BMI Readings from Last 3 Encounters:  01/16/19 29.57 kg/m  11/26/18 29.99 kg/m  11/13/18 29.99 kg/m     Lipids:  No results found for: CHOL No results found for: HDL No results found for: LDLCALC No results found for: TRIG No results found for: CHOLHDL No results found for: LDLDIRECT Glucose:  Glucose  Date Value Ref Range Status  11/13/2018 78 65 - 99 mg/dL Final   Glucose, Bld  Date Value Ref Range Status  09/27/2016 81 65 - 99 mg/dL Final      [facility] Visit from 01/16/2019 in [facility]  AUDIT-C Score  0      Single STD testing and prevention (HIV/chl/gon/syphilis): N/A   Skin cancer: discussed atypical lesions   IPSS Questionnaire (AUA-7): Over the past month.   1)  How often have you had a sensation of not emptying your bladder completely after you finish urinating?  0 - Not at all  2)  How often have you had to urinate again  less than two hours after you finished urinating? 1 - Less than 1 time in 5  3)  How often have you found you stopped and started again several times when you urinated?  0 - Not at all  4) How difficult have you found it to postpone urination?  0 - Not at all  5) How often have you had a weak urinary stream?  0 - Not at all  6) How often have you had to push or strain to begin urination?  0 - Not at all  7) How many times did you most typically get up to urinate from the time you went to bed until the time you got up in the morning?  0 - None  Total score:  0-7 mildly symptomatic   8-19 moderately symptomatic   20-35 severely symptomatic     Advanced Care Planning: A voluntary discussion about advance care planning including the explanation and discussion of advance directives.  Discussed health care proxy and Living will, and the  patient was able to identify a health care proxy as mother .  Patient does not have a living will at present time.   Patient Active Problem List   Diagnosis Date Noted  . Memory loss 11/13/2018  . Accident 11/13/2018  . Onychomycosis of toenail 09/27/2016  . Nut allergy 02/04/2016  . Asthma, mild intermittent 02/04/2016  . Seasonal allergies 02/04/2016    Past Surgical History:  Procedure Laterality Date  . PILONIDAL CYST EXCISION  2002-2004  . REPLACEMENT DISC ANTERIOR LUMBAR SPINE  2005   Charte Disc Replacement  . SPINE SURGERY      Family History  Problem Relation Age of Onset  . Asthma Mother   . Heart attack Father   . Diabetes Maternal Grandfather   . Stroke Maternal Grandfather   . Arthritis Maternal Grandfather     Social History   Socioeconomic History  . Marital status: Single    Spouse name: Not on file  . Number of children: 0  . Years of education: college  . Highest education level: Master's degree (e.g., MA, MS, MEng, MEd, MSW, MBA)  Occupational History  . Occupation: professor    Comment: Sports administrator   . Occupation: self employed   Social Needs  . Financial resource strain: Not hard at all  . Food insecurity    Worry: Never true    Inability: Never true  . Transportation needs    Medical: No    Non-medical: No  Tobacco Use  . Smoking status: Never Smoker  . Smokeless tobacco: Never Used  Substance and Sexual Activity  . Alcohol use: Never    Frequency: Never    Comment: socially but none since 08/2018  . Drug use: No  . Sexual activity: Not Currently  Lifestyle  . Physical activity    Days per week: 3 days    Minutes per session: 40 min  . Stress: Only a little  Relationships  . Social connections    Talks on phone: More than three times a week    Gets together: More than three times a week    Attends religious service: Never    Active member of club or organization: No    Attends meetings of clubs or organizations: Never    Relationship  status: Never married  . Intimate partner violence    Fear of current or ex partner: No    Emotionally abused: No    Physically abused: No  Forced sexual activity: No  Other Topics Concern  . Not on file  Social History Narrative   Teaches at [employer], 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    Lives alone.   Right-handed.   No caffeine use.     Current Outpatient Medications:  .  cetirizine (ZYRTEC) 10 MG tablet, Take 10 mg by mouth daily., Disp: , Rfl:  .  EPINEPHrine 0.3 mg/0.3 mL IJ SOAJ injection, Inject 0.3 mg into the muscle once as needed (Allergic reaction). , Disp: , Rfl:  .  naproxen sodium (ALEVE) 220 MG tablet, Take 220 mg by mouth., Disp: , Rfl:  .  amitriptyline (ELAVIL) 25 MG tablet, Take 1 tablet (25 mg total) by mouth at bedtime., Disp: 30 tablet, Rfl: 1  Allergies  Allergen Reactions  . Latex Itching and Swelling  . Other     Kiwi, bananas and avacados, stabbing pain in stomach  . Peanut-Containing Drug Products Hives  . Tree Extract Hives  . Shellfish Allergy Rash     ROS  Constitutional: Negative for fever or weight change.  Respiratory: Negative for cough and shortness of breath.   Cardiovascular: Negative for chest pain or palpitations.  Gastrointestinal: Negative for abdominal pain, no bowel changes.  Musculoskeletal: Negative for gait problem or joint swelling.  Skin: Negative for rash.  Neurological: Negative for dizziness or headache.  No other specific complaints in a complete review of systems (except as listed in HPI above).   Objective  Vitals:   01/16/19 1046  BP: 110/82  Pulse: 67  Resp: 16  Temp: (!) 97.5 F (36.4 C)  TempSrc: Temporal  SpO2: 98%  Weight: 206 lb 1.6 oz (93.5 kg)  Height: 5\' 10"  (1.778 m)    Body mass index is 29.57 kg/m.  Physical Exam  Constitutional: Patient appears well-developed and well-nourished. No distress.  HENT: Head: Normocephalic and atraumatic. Ears: B TMs ok, no erythema or effusion; Nose: Nose normal.  Mouth/Throat: Oropharynx is clear and moist. No oropharyngeal exudate.  Eyes: Conjunctivae and EOM are normal. Pupils are equal, round, and reactive to light. No scleral icterus.  Neck: Normal range of motion. Neck supple. No JVD present. No thyromegaly present.  Cardiovascular: Normal rate, regular rhythm and normal heart sounds.  No murmur heard. No BLE edema. Pulmonary/Chest: Effort normal and breath sounds normal. No respiratory distress. Abdominal: Soft. Bowel sounds are normal, no distension. There is no tenderness. no masses MALE GENITALIA: Normal descended testes bilaterally, no masses palpated, no hernias, no lesions, no discharge RECTAL: Prostate normal size and consistency, no rectal masses or hemorrhoids Musculoskeletal: Normal range of motion, no joint effusions. No gross deformities Neurological: he is alert and oriented to person, place, and time. No cranial nerve deficit. Coordination, balance, strength, speech and gait are normal.  Skin: Skin is warm and dry. No rash noted. No erythema. Multiple moles, needs to go back to [facility] Psychiatric: Patient has a normal mood and affect. behavior is normal. Judgment and thought content normal.   Recent Results (from the past 2160 hour(s))  Comprehensive metabolic panel     Status: Abnormal   Collection Time: 11/13/18  3:01 PM  Result Value Ref Range   Glucose 78 65 - 99 mg/dL   BUN 18 6 - 20 mg/dL   Creatinine, Ser 1.17 0.76 - 1.27 mg/dL   GFR calc non Af Amer 80 >59 mL/min/1.73   GFR calc Af Amer 92 >59 mL/min/1.73   BUN/Creatinine Ratio 15 9 - 20   Sodium 139 134 -  144 mmol/L   Potassium 4.2 3.5 - 5.2 mmol/L   Chloride 100 96 - 106 mmol/L   CO2 21 20 - 29 mmol/L   Calcium 9.6 8.7 - 10.2 mg/dL   Total Protein 7.3 6.0 - 8.5 g/dL   Albumin 4.8 4.0 - 5.0 g/dL   Globulin, Total 2.5 1.5 - 4.5 g/dL   Albumin/Globulin Ratio 1.9 1.2 - 2.2   Bilirubin Total 0.4 0.0 - 1.2 mg/dL   Alkaline Phosphatase 60 39 - 117 IU/L    AST 38 0 - 40 IU/L   ALT 86 (H) 0 - 44 IU/L  CBC With Differential     Status: None   Collection Time: 11/13/18  3:01 PM  Result Value Ref Range   WBC 7.5 3.4 - 10.8 x10E3/uL   RBC 5.02 4.14 - 5.80 x10E6/uL   Hemoglobin 15.5 13.0 - 17.7 g/dL   Hematocrit 46.1 37.5 - 51.0 %   MCV 92 79 - 97 fL   MCH 30.9 26.6 - 33.0 pg   MCHC 33.6 31.5 - 35.7 g/dL   RDW 12.0 11.6 - 15.4 %   Neutrophils 65 Not Estab. %   Lymphs 25 Not Estab. %   Monocytes 7 Not Estab. %   Eos 3 Not Estab. %   Basos 0 Not Estab. %   Neutrophils Absolute 4.9 1.4 - 7.0 x10E3/uL   Lymphocytes Absolute 1.9 0.7 - 3.1 x10E3/uL   Monocytes Absolute 0.5 0.1 - 0.9 x10E3/uL   EOS (ABSOLUTE) 0.2 0.0 - 0.4 x10E3/uL   Basophils Absolute 0.0 0.0 - 0.2 x10E3/uL   Immature Granulocytes 0 Not Estab. %   Immature Grans (Abs) 0.0 0.0 - 0.1 x10E3/uL  Vitamin B12     Status: None   Collection Time: 11/13/18  3:01 PM  Result Value Ref Range   Vitamin B-12 411 232 - 1,245 pg/mL  RPR     Status: None   Collection Time: 11/13/18  3:01 PM  Result Value Ref Range   RPR Ser Ql Non Reactive Non Reactive  TSH     Status: None   Collection Time: 11/13/18  3:01 PM  Result Value Ref Range   TSH 1.850 0.450 - 4.500 uIU/mL     PHQ2/9: Depression screen [facility] 2/9 01/16/2019 10/13/2018 08/17/2018 02/07/2018 02/04/2016  Decreased Interest 0 0 0 0 0  Down, Depressed, Hopeless 0 0 0 0 0  PHQ - 2 Score 0 0 0 0 0  Altered sleeping 0 2 0 - -  Tired, decreased energy 0 2 0 - -  Change in appetite 0 1 0 - -  Feeling bad or failure about yourself  0 0 0 - -  Trouble concentrating 0 1 0 - -  Moving slowly or fidgety/restless 0 1 0 - -  Suicidal thoughts 0 0 0 - -  PHQ-9 Score 0 7 0 - -  Difficult doing work/chores - Somewhat difficult Not difficult at all - -     Fall Risk: Fall Risk  01/16/2019 10/13/2018 08/17/2018 02/07/2018 02/04/2016  Falls in the past year? 0 0 0 No Yes  Number falls in past yr: 0 0 0 - 1  Injury with Fall? 0 0 0 - Yes      Functional Status Survey: Is the patient deaf or have difficulty hearing?: No Does the patient have difficulty seeing, even when wearing glasses/contacts?: No Does the patient have difficulty concentrating, remembering, or making decisions?: Yes Does the patient have difficulty walking or climbing stairs?: No  Does the patient have difficulty dressing or bathing?: No Does the patient have difficulty doing errands alone such as visiting a doctor's office or shopping?: No    Assessment & Plan  1. Post concussion syndrome  - amitriptyline (ELAVIL) 25 MG tablet; Take 1 tablet (25 mg total) by mouth at bedtime.  Dispense: 90 tablet; Refill: 1  2. Well adult exam  He refuses all immunizations  3. Multiple atypical skin moles  - Ambulatory referral to Dermatology  -Prostate cancer screening and PSA options (with potential risks and benefits of testing vs not testing) were discussed along with recent recs/guidelines. -USPSTF grade A and B recommendations reviewed with patient; age-appropriate recommendations, preventive care, screening tests, etc discussed and encouraged; healthy living encouraged; see AVS for patient education given to patient -Discussed importance of 150 minutes of physical activity weekly, eat two servings of fish weekly, eat one serving of tree nuts ( cashews, pistachios, pecans, almonds.Marland Kitchen) every other day, eat 6 servings of fruit/vegetables daily and drink plenty of water and avoid sweet beverages.

## 2019-02-13 ENCOUNTER — Encounter: Payer: Self-pay | Admitting: Family Medicine

## 2019-02-13 ENCOUNTER — Other Ambulatory Visit: Payer: Self-pay

## 2019-02-13 ENCOUNTER — Ambulatory Visit (INDEPENDENT_AMBULATORY_CARE_PROVIDER_SITE_OTHER): Payer: 59 | Admitting: Family Medicine

## 2019-02-13 VITALS — BP 126/98 | HR 90 | Temp 97.1°F | Resp 16 | Ht 70.0 in | Wt 207.8 lb

## 2019-02-13 DIAGNOSIS — L299 Pruritus, unspecified: Secondary | ICD-10-CM

## 2019-02-13 DIAGNOSIS — Z23 Encounter for immunization: Secondary | ICD-10-CM

## 2019-02-13 DIAGNOSIS — R21 Rash and other nonspecific skin eruption: Secondary | ICD-10-CM | POA: Diagnosis not present

## 2019-02-13 MED ORDER — MUPIROCIN 2 % EX OINT: 1.0000 "application " | TOPICAL_OINTMENT | Freq: Two times a day (BID) | CUTANEOUS | 0 refills | Status: DC

## 2019-02-13 MED ORDER — HYDROXYZINE HCL 10 MG PO TABS: 10.0000 mg | ORAL_TABLET | Freq: Three times a day (TID) | ORAL | 0 refills | Status: DC | PRN

## 2019-02-13 MED ORDER — CLOBETASOL PROP EMOLLIENT BASE 0.05 % EX CREA: 1.0000 g | TOPICAL_CREAM | Freq: Two times a day (BID) | CUTANEOUS | 0 refills | Status: DC
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
## 2019-04-17 ENCOUNTER — Ambulatory Visit: Payer: 59 | Admitting: Family Medicine

## 2019-04-27 ENCOUNTER — Other Ambulatory Visit: Payer: Self-pay | Admitting: Family Medicine

## 2019-04-27 DIAGNOSIS — F0781 Postconcussional syndrome: Secondary | ICD-10-CM

## 2019-05-21 ENCOUNTER — Other Ambulatory Visit: Payer: Self-pay

## 2019-05-21 ENCOUNTER — Ambulatory Visit (INDEPENDENT_AMBULATORY_CARE_PROVIDER_SITE_OTHER): Payer: 59 | Admitting: Family Medicine

## 2019-05-21 ENCOUNTER — Encounter: Payer: Self-pay | Admitting: Family Medicine

## 2019-05-21 VITALS — BP 112/80 | HR 94 | Temp 97.3°F | Resp 16 | Ht 70.0 in | Wt 205.7 lb

## 2019-05-21 DIAGNOSIS — Z114 Encounter for screening for human immunodeficiency virus [HIV]: Secondary | ICD-10-CM

## 2019-05-21 DIAGNOSIS — R21 Rash and other nonspecific skin eruption: Secondary | ICD-10-CM

## 2019-05-21 DIAGNOSIS — Z13220 Encounter for screening for lipoid disorders: Secondary | ICD-10-CM

## 2019-05-21 DIAGNOSIS — F0781 Postconcussional syndrome: Secondary | ICD-10-CM | POA: Diagnosis not present

## 2019-05-21 DIAGNOSIS — R748 Abnormal levels of other serum enzymes: Secondary | ICD-10-CM

## 2019-05-21 DIAGNOSIS — Z1159 Encounter for screening for other viral diseases: Secondary | ICD-10-CM

## 2019-05-21 DIAGNOSIS — Z131 Encounter for screening for diabetes mellitus: Secondary | ICD-10-CM | POA: Diagnosis not present

## 2019-05-21 DIAGNOSIS — Z113 Encounter for screening for infections with a predominantly sexual mode of transmission: Secondary | ICD-10-CM
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
## 2019-05-23 LAB — COMPLETE METABOLIC PANEL WITH GFR
AG Ratio: 1.5 (calc) (ref 1.0–2.5)
ALT: 93 U/L — ABNORMAL HIGH (ref 9–46)
AST: 35 U/L (ref 10–40)
Albumin: 4.4 g/dL (ref 3.6–5.1)
Alkaline phosphatase (APISO): 59 U/L (ref 36–130)
BUN: 16 mg/dL (ref 7–25)
CO2: 28 mmol/L (ref 20–32)
Calcium: 9.5 mg/dL (ref 8.6–10.3)
Chloride: 103 mmol/L (ref 98–110)
Creat: 1.08 mg/dL (ref 0.60–1.35)
GFR, Est African American: 102 mL/min/{1.73_m2} (ref >=60)
GFR, Est Non African American: 88 mL/min/{1.73_m2} (ref >=60)
Globulin: 2.9 g/dL (calc) (ref 1.9–3.7)
Glucose, Bld: 87 mg/dL (ref 65–99)
Potassium: 4.3 mmol/L (ref 3.5–5.3)
Sodium: 138 mmol/L (ref 135–146)
Total Bilirubin: 0.5 mg/dL (ref 0.2–1.2)
Total Protein: 7.3 g/dL (ref 6.1–8.1)

## 2019-05-23 LAB — HEMOGLOBIN A1C
Hgb A1c MFr Bld: 5.4 % of total Hgb (ref <5.7)
Mean Plasma Glucose: 108 (calc)
eAG (mmol/L): 6 (calc)

## 2019-05-23 LAB — HEPATITIS C ANTIBODY
Hepatitis C Ab: NONREACTIVE
SIGNAL TO CUT-OFF: 0.02 (ref <1.00)

## 2019-05-23 LAB — HIV ANTIBODY (ROUTINE TESTING W REFLEX): HIV 1&2 Ab, 4th Generation: NONREACTIVE

## 2019-05-23 LAB — LIPID PANEL
Cholesterol: 214 mg/dL — ABNORMAL HIGH (ref <200)
HDL: 37 mg/dL — ABNORMAL LOW (ref >=40)
LDL Cholesterol (Calc): 142 mg/dL (calc) — ABNORMAL HIGH
Non-HDL Cholesterol (Calc): 177 mg/dL (calc) — ABNORMAL HIGH (ref <130)
Total CHOL/HDL Ratio: 5.8 (calc) — ABNORMAL HIGH (ref <5.0)
Triglycerides: 210 mg/dL — ABNORMAL HIGH (ref <150)

## 2019-05-23 LAB — RPR: RPR Ser Ql: NONREACTIVE

## 2019-05-25 ENCOUNTER — Encounter: Payer: Self-pay | Admitting: Family Medicine

## 2019-06-18 ENCOUNTER — Telehealth: Payer: Self-pay | Admitting: *Deleted
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
## 2019-07-23 ENCOUNTER — Other Ambulatory Visit: Payer: Self-pay | Admitting: Family Medicine

## 2019-07-23 DIAGNOSIS — F0781 Postconcussional syndrome: Secondary | ICD-10-CM

## 2019-07-23 NOTE — Telephone Encounter (Signed)
Copied from [place] [id number]. Topic: Quick Communication - Rx Refill/Question >> Jul 23, 2019  1:04 PM Mcneil, Ja-Kwan wrote: Medication: amitriptyline (ELAVIL) 25 MG tablet  Has the patient contacted their pharmacy? no  Preferred Pharmacy (with phone number or street name): [id number] [address], [place] Phone: [phone] Fax: [phone]  Agent: Please be advised that RX refills may take up to 3 business days. We ask that you follow-up with your pharmacy.
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
## 2019-07-24 MED ORDER — AMITRIPTYLINE HCL 25 MG PO TABS: ORAL_TABLET | ORAL | 1 refills | Status: DC

## 2019-07-30 ENCOUNTER — Other Ambulatory Visit: Payer: Self-pay

## 2019-07-30 ENCOUNTER — Ambulatory Visit (INDEPENDENT_AMBULATORY_CARE_PROVIDER_SITE_OTHER): Payer: 59 | Admitting: Neurology

## 2019-07-30 ENCOUNTER — Encounter: Payer: Self-pay | Admitting: Neurology

## 2019-07-30 VITALS — BP 125/90 | HR 77 | Temp 96.8°F | Ht 70.0 in | Wt 209.0 lb

## 2019-07-30 DIAGNOSIS — G43709 Chronic migraine without aura, not intractable, without status migrainosus: Secondary | ICD-10-CM | POA: Diagnosis not present

## 2019-07-30 DIAGNOSIS — Z87828 Personal history of other (healed) physical injury and trauma: Secondary | ICD-10-CM

## 2019-07-30 DIAGNOSIS — IMO0002 Reserved for concepts with insufficient information to code with codable children: Secondary | ICD-10-CM | POA: Insufficient documentation

## 2019-07-30 MED ORDER — RIZATRIPTAN BENZOATE 10 MG PO TBDP: 10.0000 mg | ORAL_TABLET | ORAL | 6 refills | Status: DC | PRN
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
## 2019-08-18 ENCOUNTER — Other Ambulatory Visit: Payer: Self-pay | Admitting: Neurology

## 2019-10-24 ENCOUNTER — Encounter: Payer: Self-pay | Admitting: Family Medicine

## 2019-11-26 ENCOUNTER — Ambulatory Visit: Payer: 59 | Admitting: Family Medicine

## 2019-11-27 ENCOUNTER — Other Ambulatory Visit: Payer: Self-pay | Admitting: Neurology

## 2019-12-31 NOTE — Patient Instructions (Signed)
Preventive Care 19-37 Years Old, Male Preventive care refers to lifestyle choices and visits with your health care provider that can promote health and wellness. This includes:  A yearly physical exam. This is also called an annual well check.  Regular dental and eye exams.  Immunizations.  Screening for certain conditions.  Healthy lifestyle choices, such as eating a healthy diet, getting regular exercise, not using drugs or products that contain nicotine and tobacco, and limiting alcohol use. What can I expect for my preventive care visit? Physical exam Your health care provider will check:  Height and weight. These may be used to calculate body mass index (BMI), which is a measurement that tells if you are at a healthy weight.  Heart rate and blood pressure.  Your skin for abnormal spots. Counseling Your health care provider may ask you questions about:  Alcohol, tobacco, and drug use.  Emotional well-being.  Home and relationship well-being.  Sexual activity.  Eating habits.  Work and work Statistician. What immunizations do I need?  Influenza (flu) vaccine  This is recommended every year. Tetanus, diphtheria, and pertussis (Tdap) vaccine  You may need a Td booster every 10 years. Varicella (chickenpox) vaccine  You may need this vaccine if you have not already been vaccinated. Human papillomavirus (HPV) vaccine  If recommended by your health care provider, you may need three doses over 6 months. Measles, mumps, and rubella (MMR) vaccine  You may need at least one dose of MMR. You may also need a second dose. Meningococcal conjugate (MenACWY) vaccine  One dose is recommended if you are 45-76 years old and a Market researcher living in a residence hall, or if you have one of several medical conditions. You may also need additional booster doses. Pneumococcal conjugate (PCV13) vaccine  You may need this if you have certain conditions and were not  previously vaccinated. Pneumococcal polysaccharide (PPSV23) vaccine  You may need one or two doses if you smoke cigarettes or if you have certain conditions. Hepatitis A vaccine  You may need this if you have certain conditions or if you travel or work in places where you may be exposed to hepatitis A. Hepatitis B vaccine  You may need this if you have certain conditions or if you travel or work in places where you may be exposed to hepatitis B. Haemophilus influenzae type b (Hib) vaccine  You may need this if you have certain risk factors. You may receive vaccines as individual doses or as more than one vaccine together in one shot (combination vaccines). Talk with your health care provider about the risks and benefits of combination vaccines. What tests do I need? Blood tests  Lipid and cholesterol levels. These may be checked every 5 years starting at age 17.  Hepatitis C test.  Hepatitis B test. Screening   Diabetes screening. This is done by checking your blood sugar (glucose) after you have not eaten for a while (fasting).  Sexually transmitted disease (STD) testing. Talk with your health care provider about your test results, treatment options, and if necessary, the need for more tests. Follow these instructions at home: Eating and drinking   Eat a diet that includes fresh fruits and vegetables, whole grains, lean protein, and low-fat dairy products.  Take vitamin and mineral supplements as recommended by your health care provider.  Do not drink alcohol if your health care provider tells you not to drink.  If you drink alcohol: ? Limit how much you have to 0-2  drinks a day. ? Be aware of how much alcohol is in your drink. In the [place] one drink equals one 12 oz bottle of beer (355 mL), one 5 oz glass of wine (148 mL), or one 1 oz glass of hard liquor (44 mL). Lifestyle  Take daily care of your teeth and gums.  Stay active. Exercise for at least 30 minutes on 5 or  more days each week.  Do not use any products that contain nicotine or tobacco, such as cigarettes, e-cigarettes, and chewing tobacco. If you need help quitting, ask your health care provider.  If you are sexually active, practice safe sex. Use a condom or other form of protection to prevent STIs (sexually transmitted infections). What's next?  Go to your health care provider once a year for a well check visit.  Ask your health care provider how often you should have your eyes and teeth checked.  Stay up to date on all vaccines. This information is not intended to replace advice given to you by your health care provider. Make sure you discuss any questions you have with your health care provider. Document Revised: 04/20/2018 Document Reviewed: 04/20/2018 Elsevier Patient Education  2020 [employer].

## 2020-01-01 ENCOUNTER — Ambulatory Visit (INDEPENDENT_AMBULATORY_CARE_PROVIDER_SITE_OTHER): Payer: 59 | Admitting: Family Medicine

## 2020-01-01 ENCOUNTER — Other Ambulatory Visit: Payer: Self-pay

## 2020-01-01 ENCOUNTER — Encounter: Payer: Self-pay | Admitting: Family Medicine

## 2020-01-01 VITALS — BP 120/70 | HR 78 | Temp 98.2°F | Resp 16 | Ht 70.0 in | Wt 203.5 lb

## 2020-01-01 DIAGNOSIS — F0781 Postconcussional syndrome: Secondary | ICD-10-CM | POA: Diagnosis not present

## 2020-01-01 DIAGNOSIS — J4520 Mild intermittent asthma, uncomplicated: Secondary | ICD-10-CM

## 2020-01-01 DIAGNOSIS — E785 Hyperlipidemia, unspecified: Secondary | ICD-10-CM

## 2020-01-01 DIAGNOSIS — IMO0002 Reserved for concepts with insufficient information to code with codable children: Secondary | ICD-10-CM

## 2020-01-01 DIAGNOSIS — G43709 Chronic migraine without aura, not intractable, without status migrainosus: Secondary | ICD-10-CM

## 2020-01-01 DIAGNOSIS — J3089 Other allergic rhinitis: Secondary | ICD-10-CM

## 2020-01-01 DIAGNOSIS — R748 Abnormal levels of other serum enzymes: Secondary | ICD-10-CM | POA: Diagnosis not present

## 2020-01-01 DIAGNOSIS — J302 Other seasonal allergic rhinitis: Secondary | ICD-10-CM

## 2020-01-01 DIAGNOSIS — G4709 Other insomnia: Secondary | ICD-10-CM

## 2020-01-01 MED ORDER — AMITRIPTYLINE HCL 25 MG PO TABS: ORAL_TABLET | ORAL | 1 refills | Status: DC

## 2020-01-01 MED ORDER — FLUTICASONE PROPIONATE 50 MCG/ACT NA SUSP: 2.0000 | Freq: Every day | NASAL | 0 refills | Status: DC
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
## 2020-01-09 ENCOUNTER — Other Ambulatory Visit: Payer: 59

## 2020-01-09 ENCOUNTER — Other Ambulatory Visit: Payer: Self-pay

## 2020-01-09 DIAGNOSIS — E785 Hyperlipidemia, unspecified: Secondary | ICD-10-CM

## 2020-01-09 DIAGNOSIS — R748 Abnormal levels of other serum enzymes: Secondary | ICD-10-CM

## 2020-01-10 LAB — COMPREHENSIVE METABOLIC PANEL
ALT: 28 IU/L (ref 0–44)
AST: 20 IU/L (ref 0–40)
Albumin/Globulin Ratio: 1.7 (ref 1.2–2.2)
Albumin: 4.1 g/dL (ref 4.0–5.0)
Alkaline Phosphatase: 49 IU/L (ref 48–121)
BUN/Creatinine Ratio: 10 (ref 9–20)
BUN: 12 mg/dL (ref 6–20)
Bilirubin Total: 0.5 mg/dL (ref 0.0–1.2)
CO2: 22 mmol/L (ref 20–29)
Calcium: 8.8 mg/dL (ref 8.7–10.2)
Chloride: 105 mmol/L (ref 96–106)
Creatinine, Ser: 1.15 mg/dL (ref 0.76–1.27)
GFR calc Af Amer: 93 mL/min/{1.73_m2} (ref >59)
GFR calc non Af Amer: 81 mL/min/{1.73_m2} (ref >59)
Globulin, Total: 2.4 g/dL (ref 1.5–4.5)
Glucose: 83 mg/dL (ref 65–99)
Potassium: 4 mmol/L (ref 3.5–5.2)
Sodium: 140 mmol/L (ref 134–144)
Total Protein: 6.5 g/dL (ref 6.0–8.5)

## 2020-01-10 LAB — LIPID PANEL
Chol/HDL Ratio: 4.5 ratio (ref 0.0–5.0)
Cholesterol, Total: 147 mg/dL (ref 100–199)
HDL: 33 mg/dL — ABNORMAL LOW (ref >39)
LDL Chol Calc (NIH): 98 mg/dL (ref 0–99)
Triglycerides: 80 mg/dL (ref 0–149)
VLDL Cholesterol Cal: 16 mg/dL (ref 5–40)

## 2020-01-13 ENCOUNTER — Encounter: Payer: Self-pay | Admitting: Family Medicine

## 2020-01-15 ENCOUNTER — Other Ambulatory Visit: Payer: Self-pay

## 2020-01-15 DIAGNOSIS — F0781 Postconcussional syndrome: Secondary | ICD-10-CM

## 2020-01-15 DIAGNOSIS — G4709 Other insomnia: Secondary | ICD-10-CM

## 2020-01-15 MED ORDER — AMITRIPTYLINE HCL 25 MG PO TABS: ORAL_TABLET | ORAL | 1 refills | Status: DC

## 2020-04-07 ENCOUNTER — Encounter: Payer: Self-pay | Admitting: Emergency Medicine

## 2020-04-07 ENCOUNTER — Other Ambulatory Visit: Payer: Self-pay

## 2020-04-07 ENCOUNTER — Emergency Department
Admission: EM | Admit: 2020-04-07 | Discharge: 2020-04-07 | Disposition: A | Discharge status: Home / Self Care | Payer: 59 | Attending: Emergency Medicine | Admitting: Emergency Medicine

## 2020-04-07 ENCOUNTER — Emergency Department: Payer: 59

## 2020-04-07 DIAGNOSIS — M545 Low back pain, unspecified: Secondary | ICD-10-CM | POA: Diagnosis present

## 2020-04-07 DIAGNOSIS — J4520 Mild intermittent asthma, uncomplicated: Secondary | ICD-10-CM | POA: Insufficient documentation

## 2020-04-07 DIAGNOSIS — Z91010 Allergy to peanuts: Secondary | ICD-10-CM | POA: Insufficient documentation

## 2020-04-07 LAB — CBC
HCT: 46.7 % (ref 39.0–52.0)
Hemoglobin: 16 g/dL (ref 13.0–17.0)
MCH: 32.1 pg (ref 26.0–34.0)
MCHC: 34.3 g/dL (ref 30.0–36.0)
MCV: 93.8 fL (ref 80.0–100.0)
Platelets: 253 10*3/uL (ref 150–400)
RBC: 4.98 MIL/uL (ref 4.22–5.81)
RDW: 11.5 % (ref 11.5–15.5)
WBC: 7 10*3/uL (ref 4.0–10.5)
nRBC: 0 % (ref 0.0–0.2)

## 2020-04-07 LAB — URINALYSIS, COMPLETE (UACMP) WITH MICROSCOPIC
Bacteria, UA: NONE SEEN
Bilirubin Urine: NEGATIVE
Glucose, UA: NEGATIVE mg/dL
Hgb urine dipstick: NEGATIVE
Ketones, ur: NEGATIVE mg/dL
Leukocytes,Ua: NEGATIVE
Nitrite: NEGATIVE
Protein, ur: NEGATIVE mg/dL
Specific Gravity, Urine: 1.009 (ref 1.005–1.030)
Squamous Epithelial / HPF: NONE SEEN (ref 0–5)
pH: 6 (ref 5.0–8.0)

## 2020-04-07 LAB — BASIC METABOLIC PANEL
Anion gap: 9 (ref 5–15)
BUN: 17 mg/dL (ref 6–20)
CO2: 26 mmol/L (ref 22–32)
Calcium: 9.4 mg/dL (ref 8.9–10.3)
Chloride: 103 mmol/L (ref 98–111)
Creatinine, Ser: 1.16 mg/dL (ref 0.61–1.24)
GFR, Estimated: >60 mL/min (ref >60)
Glucose, Bld: 111 mg/dL — ABNORMAL HIGH (ref 70–99)
Potassium: 4.2 mmol/L (ref 3.5–5.1)
Sodium: 138 mmol/L (ref 135–145)

## 2020-04-07 LAB — TROPONIN I (HIGH SENSITIVITY): Troponin I (High Sensitivity): 3 ng/L (ref <18)

## 2020-04-07 MED ORDER — NAPROXEN 500 MG PO TABS
500.0000 mg | ORAL_TABLET | Freq: Once | ORAL | Status: AC
  Administered 2020-04-07: 500 mg via ORAL
  Filled 2020-04-07: qty 1

## 2020-04-07 MED ORDER — NAPROXEN 500 MG PO TABS: 500.0000 mg | ORAL_TABLET | Freq: Two times a day (BID) | ORAL | 2 refills | Status: DC

## 2020-04-07 MED ORDER — OXYCODONE HCL 5 MG PO TABS
5.0000 mg | ORAL_TABLET | Freq: Once | ORAL | Status: AC
  Administered 2020-04-07: 5 mg via ORAL
  Filled 2020-04-07: qty 1

## 2020-04-07 MED ORDER — DEXAMETHASONE SODIUM PHOSPHATE 10 MG/ML IJ SOLN
8.0000 mg | Freq: Once | INTRAMUSCULAR | Status: AC
  Administered 2020-04-07: 8 mg via INTRAMUSCULAR
  Filled 2020-04-07: qty 1
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
## 2020-04-07 NOTE — ED Triage Notes (Addendum)
Patient to ER for c/o lower back pain. Patient states he has h/o disc surgery and has had some issues with back pain, but has had more severe pain recently. Then fell this am, worsening back pain. Took ultracet at home this am. Patient is unsure of whether he lost consciousness or not. Patient reports losing 25 lbs since August, but also reports he has "been eating better".

## 2020-04-07 NOTE — ED Notes (Signed)
See triage note. Pt present to the ED c/o lower back pain. Recently has disc surgery but had a fall this AM. Unknown LOC. Pt is A&Ox4 and ambulatory to room.

## 2020-04-08 ENCOUNTER — Encounter: Payer: Self-pay | Admitting: Family Medicine

## 2020-04-09 ENCOUNTER — Encounter: Payer: Self-pay | Admitting: Family Medicine

## 2020-04-09 NOTE — Telephone Encounter (Signed)
Pt calling stating that he does not have access to his letter in [place]. Pt is requesting to have it emailed instead. Please advise.

## 2020-05-23 ENCOUNTER — Encounter: Payer: 59 | Admitting: Family Medicine
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
## 2020-07-15 ENCOUNTER — Encounter: Payer: Self-pay | Admitting: Family Medicine

## 2020-07-15 ENCOUNTER — Other Ambulatory Visit: Payer: Self-pay

## 2020-07-15 ENCOUNTER — Ambulatory Visit (INDEPENDENT_AMBULATORY_CARE_PROVIDER_SITE_OTHER): Payer: BC Managed Care – PPO | Admitting: Family Medicine

## 2020-07-15 VITALS — BP 118/88 | HR 73 | Temp 98.0°F | Resp 16 | Ht 70.0 in | Wt 190.0 lb

## 2020-07-15 DIAGNOSIS — D225 Melanocytic nevi of trunk: Secondary | ICD-10-CM | POA: Diagnosis not present

## 2020-07-15 DIAGNOSIS — J302 Other seasonal allergic rhinitis: Secondary | ICD-10-CM

## 2020-07-15 DIAGNOSIS — E785 Hyperlipidemia, unspecified: Secondary | ICD-10-CM

## 2020-07-15 DIAGNOSIS — D2271 Melanocytic nevi of right lower limb, including hip: Secondary | ICD-10-CM | POA: Diagnosis not present

## 2020-07-15 DIAGNOSIS — F0781 Postconcussional syndrome: Secondary | ICD-10-CM

## 2020-07-15 DIAGNOSIS — Z0000 Encounter for general adult medical examination without abnormal findings: Secondary | ICD-10-CM

## 2020-07-15 DIAGNOSIS — D2262 Melanocytic nevi of left upper limb, including shoulder: Secondary | ICD-10-CM | POA: Diagnosis not present

## 2020-07-15 DIAGNOSIS — J4520 Mild intermittent asthma, uncomplicated: Secondary | ICD-10-CM

## 2020-07-15 DIAGNOSIS — D2261 Melanocytic nevi of right upper limb, including shoulder: Secondary | ICD-10-CM | POA: Diagnosis not present

## 2020-07-15 DIAGNOSIS — J3089 Other allergic rhinitis: Secondary | ICD-10-CM

## 2020-07-15 DIAGNOSIS — G4709 Other insomnia: Secondary | ICD-10-CM

## 2020-07-15 MED ORDER — AMITRIPTYLINE HCL 25 MG PO TABS: ORAL_TABLET | ORAL | 1 refills | Status: DC

## 2020-07-15 MED ORDER — FLUTICASONE PROPIONATE 50 MCG/ACT NA SUSP: 2.0000 | Freq: Every day | NASAL | 0 refills | Status: DC

## 2020-07-15 NOTE — Patient Instructions (Signed)
Discussed HPV vaccine - check with insurance  Preventive Care 44-38 Years Old, Male Preventive care refers to lifestyle choices and visits with your health care provider that can promote health and wellness. This includes:  A yearly physical exam. This is also called an annual wellness visit.  Regular dental and eye exams.  Immunizations.  Screening for certain conditions.  Healthy lifestyle choices, such as: ? Eating a healthy diet. ? Getting regular exercise. ? Not using drugs or products that contain nicotine and tobacco. ? Limiting alcohol use. What can I expect for my preventive care visit? Physical exam Your health care provider may check your:  Height and weight. These may be used to calculate your BMI (body mass index). BMI is a measurement that tells if you are at a healthy weight.  Heart rate and blood pressure.  Body temperature.  Skin for abnormal spots. Counseling Your health care provider may ask you questions about your:  Past medical problems.  Family's medical history.  Alcohol, tobacco, and drug use.  Emotional well-being.  Home life and relationship well-being.  Sexual activity.  Diet, exercise, and sleep habits.  Work and work Statistician.  Access to firearms. What immunizations do I need? Vaccines are usually given at various ages, according to a schedule. Your health care provider will recommend vaccines for you based on your age, medical history, and lifestyle or other factors, such as travel or where you work.   What tests do I need? Blood tests  Lipid and cholesterol levels. These may be checked every 5 years starting at age 43.  Hepatitis C test.  Hepatitis B test. Screening  Diabetes screening. This is done by checking your blood sugar (glucose) after you have not eaten for a while (fasting).  Genital exam to check for testicular cancer or hernias.  STD (sexually transmitted disease) testing, if you are at risk. Talk with  your health care provider about your test results, treatment options, and if necessary, the need for more tests.   Follow these instructions at home: Eating and drinking  Eat a healthy diet that includes fresh fruits and vegetables, whole grains, lean protein, and low-fat dairy products.  Drink enough fluid to keep your urine pale yellow.  Take vitamin and mineral supplements as recommended by your health care provider.  Do not drink alcohol if your health care provider tells you not to drink.  If you drink alcohol: ? Limit how much you have to 0-2 drinks a day. ? Be aware of how much alcohol is in your drink. In the [place] one drink equals one 12 oz bottle of beer (355 mL), one 5 oz glass of wine (148 mL), or one 1 oz glass of hard liquor (44 mL).   Lifestyle  Take daily care of your teeth and gums. Brush your teeth every morning and night with fluoride toothpaste. Floss one time each day.  Stay active. Exercise for at least 30 minutes 5 or more days each week.  Do not use any products that contain nicotine or tobacco, such as cigarettes, e-cigarettes, and chewing tobacco. If you need help quitting, ask your health care provider.  Do not use drugs.  If you are sexually active, practice safe sex. Use a condom or other form of protection to prevent STIs (sexually transmitted infections).  Find healthy ways to cope with stress, such as: ? Meditation, yoga, or listening to music. ? Journaling. ? Talking to a trusted person. ? Spending time with friends and family. Safety  Always wear your seat belt while driving or riding in a vehicle.  Do not drive: ? If you have been drinking alcohol. Do not ride with someone who has been drinking. ? When you are tired or distracted. ? While texting.  Wear a helmet and other protective equipment during sports activities.  If you have firearms in your house, make sure you follow all gun safety procedures.  Seek help if you have been  physically or sexually abused. What's next?  Go to your health care provider once a year for an annual wellness visit.  Ask your health care provider how often you should have your eyes and teeth checked.  Stay up to date on all vaccines. This information is not intended to replace advice given to you by your health care provider. Make sure you discuss any questions you have with your health care provider. Document Revised: 01/10/2019 Document Reviewed: 04/20/2018 Elsevier Patient Education  2021 [employer].

## 2020-09-09 ENCOUNTER — Encounter: Payer: Self-pay | Admitting: Family Medicine

## 2021-01-10 ENCOUNTER — Other Ambulatory Visit: Payer: Self-pay | Admitting: Family Medicine

## 2021-01-10 DIAGNOSIS — G4709 Other insomnia: Secondary | ICD-10-CM

## 2021-01-10 DIAGNOSIS — F0781 Postconcussional syndrome: Secondary | ICD-10-CM

## 2021-01-10 NOTE — Telephone Encounter (Signed)
Requested Prescriptions  Pending Prescriptions Disp Refills  . amitriptyline (ELAVIL) 25 MG tablet [Pharmacy Med Name: AMITRIPTYLINE HCL 25 MG TAB] 90 tablet 0    Sig: TAKE 1 TABLET BY MOUTH EVERYDAY AT BEDTIME     Psychiatry:  Antidepressants - Heterocyclics (TCAs) Passed - 01/10/2021  8:58 AM      Passed - Valid encounter within last 6 months    Recent Outpatient Visits          5 months ago Mild intermittent asthma without complication   [facility] Steele Sizer, MD   1 year ago Chronic migraine   [facility] Steele Sizer, MD   1 year ago Post concussion syndrome   [facility] Steele Sizer, MD   1 year ago [facility] Steele Sizer, MD   1 year ago Post concussion syndrome   [facility] Steele Sizer, MD      Future Appointments            In 5 days Steele Sizer, MD [facility], [facility]
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
## 2021-01-15 ENCOUNTER — Ambulatory Visit: Payer: BC Managed Care – PPO | Admitting: Family Medicine

## 2021-01-15 ENCOUNTER — Other Ambulatory Visit: Payer: Self-pay

## 2021-01-15 ENCOUNTER — Encounter: Payer: Self-pay | Admitting: Family Medicine

## 2021-01-15 VITALS — BP 120/74 | HR 88 | Temp 98.5°F | Resp 16 | Ht 70.0 in | Wt 188.0 lb

## 2021-01-15 DIAGNOSIS — J3089 Other allergic rhinitis: Secondary | ICD-10-CM | POA: Diagnosis not present

## 2021-01-15 DIAGNOSIS — J4520 Mild intermittent asthma, uncomplicated: Secondary | ICD-10-CM

## 2021-01-15 DIAGNOSIS — E785 Hyperlipidemia, unspecified: Secondary | ICD-10-CM

## 2021-01-15 DIAGNOSIS — G43009 Migraine without aura, not intractable, without status migrainosus: Secondary | ICD-10-CM

## 2021-01-15 DIAGNOSIS — R198 Other specified symptoms and signs involving the digestive system and abdomen: Secondary | ICD-10-CM

## 2021-01-15 DIAGNOSIS — G4709 Other insomnia: Secondary | ICD-10-CM

## 2021-01-15 DIAGNOSIS — Z131 Encounter for screening for diabetes mellitus: Secondary | ICD-10-CM

## 2021-01-15 DIAGNOSIS — J302 Other seasonal allergic rhinitis: Secondary | ICD-10-CM

## 2021-01-15 DIAGNOSIS — F0781 Postconcussional syndrome: Secondary | ICD-10-CM | POA: Diagnosis not present

## 2021-01-15 MED ORDER — AMITRIPTYLINE HCL 25 MG PO TABS: 25.0000 mg | ORAL_TABLET | Freq: Every day | ORAL | 1 refills | Status: DC

## 2021-01-20 ENCOUNTER — Other Ambulatory Visit: Payer: Self-pay

## 2021-01-20 ENCOUNTER — Other Ambulatory Visit: Payer: 59

## 2021-01-20 DIAGNOSIS — Z131 Encounter for screening for diabetes mellitus: Secondary | ICD-10-CM

## 2021-01-20 DIAGNOSIS — E785 Hyperlipidemia, unspecified: Secondary | ICD-10-CM

## 2021-01-21 LAB — COMPREHENSIVE METABOLIC PANEL
ALT: 44 IU/L (ref 0–44)
AST: 24 IU/L (ref 0–40)
Albumin/Globulin Ratio: 1.9 (ref 1.2–2.2)
Albumin: 4.3 g/dL (ref 4.0–5.0)
Alkaline Phosphatase: 61 IU/L (ref 44–121)
BUN/Creatinine Ratio: 12 (ref 9–20)
BUN: 12 mg/dL (ref 6–20)
Bilirubin Total: 0.4 mg/dL (ref 0.0–1.2)
CO2: 23 mmol/L (ref 20–29)
Calcium: 9 mg/dL (ref 8.7–10.2)
Chloride: 102 mmol/L (ref 96–106)
Creatinine, Ser: 0.99 mg/dL (ref 0.76–1.27)
Globulin, Total: 2.3 g/dL (ref 1.5–4.5)
Glucose: 100 mg/dL — ABNORMAL HIGH (ref 65–99)
Potassium: 4.3 mmol/L (ref 3.5–5.2)
Sodium: 139 mmol/L (ref 134–144)
Total Protein: 6.6 g/dL (ref 6.0–8.5)
eGFR: 100 mL/min/{1.73_m2} (ref >59)

## 2021-01-21 LAB — LIPID PANEL
Chol/HDL Ratio: 6.5 ratio — ABNORMAL HIGH (ref 0.0–5.0)
Cholesterol, Total: 209 mg/dL — ABNORMAL HIGH (ref 100–199)
HDL: 32 mg/dL — ABNORMAL LOW (ref >39)
LDL Chol Calc (NIH): 132 mg/dL — ABNORMAL HIGH (ref 0–99)
Triglycerides: 251 mg/dL — ABNORMAL HIGH (ref 0–149)
VLDL Cholesterol Cal: 45 mg/dL — ABNORMAL HIGH (ref 5–40)

## 2021-01-21 LAB — HGB A1C W/O EAG: Hgb A1c MFr Bld: 5.3 % (ref 4.8–5.6)

## 2021-02-10 ENCOUNTER — Other Ambulatory Visit: Payer: Self-pay

## 2021-02-10 DIAGNOSIS — G4709 Other insomnia: Secondary | ICD-10-CM

## 2021-02-10 DIAGNOSIS — F0781 Postconcussional syndrome: Secondary | ICD-10-CM

## 2021-02-10 MED ORDER — AMITRIPTYLINE HCL 25 MG PO TABS: 25.0000 mg | ORAL_TABLET | Freq: Every day | ORAL | 1 refills | Status: DC

## 2021-05-10 DIAGNOSIS — M25811 Other specified joint disorders, right shoulder: Secondary | ICD-10-CM | POA: Insufficient documentation

## 2021-06-09 ENCOUNTER — Other Ambulatory Visit: Payer: Self-pay

## 2021-06-09 ENCOUNTER — Ambulatory Visit: Payer: BC Managed Care – PPO | Admitting: Medical

## 2021-06-09 VITALS — BP 110/66 | HR 60 | Temp 97.4°F | Resp 16

## 2021-06-09 DIAGNOSIS — S46911A Strain of unspecified muscle, fascia and tendon at shoulder and upper arm level, right arm, initial encounter: Secondary | ICD-10-CM
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
## 2021-06-14 DIAGNOSIS — M25511 Pain in right shoulder: Secondary | ICD-10-CM | POA: Diagnosis not present

## 2021-06-23 DIAGNOSIS — M25511 Pain in right shoulder: Secondary | ICD-10-CM | POA: Diagnosis not present

## 2021-07-27 NOTE — Patient Instructions (Signed)
Preventive Care 50-39 Years Old, Male ?Preventive care refers to lifestyle choices and visits with your health care provider that can promote health and wellness. Preventive care visits are also called wellness exams. ?What can I expect for my preventive care visit? ?Counseling ?During your preventive care visit, your health care provider may ask about your: ?Medical history, including: ?Past medical problems. ?Family medical history. ?Current health, including: ?Emotional well-being. ?Home life and relationship well-being. ?Sexual activity. ?Lifestyle, including: ?Alcohol, nicotine or tobacco, and drug use. ?Access to firearms. ?Diet, exercise, and sleep habits. ?Safety issues such as seatbelt and bike helmet use. ?Sunscreen use. ?Work and work Statistician. ?Physical exam ?Your health care provider may check your: ?Height and weight. These may be used to calculate your BMI (body mass index). BMI is a measurement that tells if you are at a healthy weight. ?Waist circumference. This measures the distance around your waistline. This measurement also tells if you are at a healthy weight and may help predict your risk of certain diseases, such as type 2 diabetes and high blood pressure. ?Heart rate and blood pressure. ?Body temperature. ?Skin for abnormal spots. ?What immunizations do I need? ?Vaccines are usually given at various ages, according to a schedule. Your health care provider will recommend vaccines for you based on your age, medical history, and lifestyle or other factors, such as travel or where you work. ?What tests do I need? ?Screening ?Your health care provider may recommend screening tests for certain conditions. This may include: ?Lipid and cholesterol levels. ?Diabetes screening. This is done by checking your blood sugar (glucose) after you have not eaten for a while (fasting). ?Hepatitis B test. ?Hepatitis C test. ?HIV (human immunodeficiency virus) test. ?STI (sexually transmitted infection)  testing, if you are at risk. ?Talk with your health care provider about your test results, treatment options, and if necessary, the need for more tests. ?Follow these instructions at home: ?Eating and drinking ? ?Eat a healthy diet that includes fresh fruits and vegetables, whole grains, lean protein, and low-fat dairy products. ?Drink enough fluid to keep your urine pale yellow. ?Take vitamin and mineral supplements as recommended by your health care provider. ?Do not drink alcohol if your health care provider tells you not to drink. ?If you drink alcohol: ?Limit how much you have to 0-2 drinks a day. ?Know how much alcohol is in your drink. In the [place] one drink equals one 12 oz bottle of beer (355 mL), one 5 oz glass of wine (148 mL), or one 1? oz glass of hard liquor (44 mL). ?Lifestyle ?Brush your teeth every morning and night with fluoride toothpaste. Floss one time each day. ?Exercise for at least 30 minutes 5 or more days each week. ?Do not use any products that contain nicotine or tobacco. These products include cigarettes, chewing tobacco, and vaping devices, such as e-cigarettes. If you need help quitting, ask your health care provider. ?Do not use drugs. ?If you are sexually active, practice safe sex. Use a condom or other form of protection to prevent STIs. ?Find healthy ways to manage stress, such as: ?Meditation, yoga, or listening to music. ?Journaling. ?Talking to a trusted person. ?Spending time with friends and family. ?Minimize exposure to UV radiation to reduce your risk of skin cancer. ?Safety ?Always wear your seat belt while driving or riding in a vehicle. ?Do not drive: ?If you have been drinking alcohol. Do not ride with someone who has been drinking. ?If you have been using any mind-altering substances or  drugs. ?While texting. ?When you are tired or distracted. ?Wear a helmet and other protective equipment during sports activities. ?If you have firearms in your house, make sure you  follow all gun safety procedures. ?Seek help if you have been physically or sexually abused. ?What's next? ?Go to your health care provider once a year for an annual wellness visit. ?Ask your health care provider how often you should have your eyes and teeth checked. ?Stay up to date on all vaccines. ?This information is not intended to replace advice given to you by your health care provider. Make sure you discuss any questions you have with your health care provider. ?Document Revised: 10/22/2020 Document Reviewed: 10/22/2020 ?Elsevier Patient Education ? [place]. ? ?
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
## 2021-07-28 ENCOUNTER — Ambulatory Visit (INDEPENDENT_AMBULATORY_CARE_PROVIDER_SITE_OTHER): Payer: BC Managed Care – PPO | Admitting: Family Medicine

## 2021-07-28 ENCOUNTER — Encounter: Payer: Self-pay | Admitting: Family Medicine

## 2021-07-28 ENCOUNTER — Other Ambulatory Visit: Payer: Self-pay

## 2021-07-28 VITALS — BP 126/72 | HR 79 | Resp 16 | Ht 69.0 in | Wt 187.0 lb

## 2021-07-28 DIAGNOSIS — Z0000 Encounter for general adult medical examination without abnormal findings: Secondary | ICD-10-CM

## 2021-07-28 DIAGNOSIS — Z113 Encounter for screening for infections with a predominantly sexual mode of transmission: Secondary | ICD-10-CM

## 2021-07-28 DIAGNOSIS — E785 Hyperlipidemia, unspecified: Secondary | ICD-10-CM | POA: Diagnosis not present

## 2021-07-28 DIAGNOSIS — K648 Other hemorrhoids: Secondary | ICD-10-CM | POA: Diagnosis not present

## 2021-08-04 ENCOUNTER — Other Ambulatory Visit: Payer: BC Managed Care – PPO

## 2021-08-04 DIAGNOSIS — M79644 Pain in right finger(s): Secondary | ICD-10-CM | POA: Diagnosis not present

## 2021-08-04 DIAGNOSIS — R600 Localized edema: Secondary | ICD-10-CM | POA: Diagnosis not present

## 2021-08-06 DIAGNOSIS — D2262 Melanocytic nevi of left upper limb, including shoulder: Secondary | ICD-10-CM | POA: Diagnosis not present

## 2021-08-06 DIAGNOSIS — D225 Melanocytic nevi of trunk: Secondary | ICD-10-CM | POA: Diagnosis not present

## 2021-08-06 DIAGNOSIS — D2261 Melanocytic nevi of right upper limb, including shoulder: Secondary | ICD-10-CM | POA: Diagnosis not present

## 2021-08-06 DIAGNOSIS — D2272 Melanocytic nevi of left lower limb, including hip: Secondary | ICD-10-CM | POA: Diagnosis not present

## 2021-08-10 ENCOUNTER — Telehealth: Payer: Self-pay | Admitting: Family Medicine

## 2021-08-10 ENCOUNTER — Other Ambulatory Visit: Payer: Self-pay

## 2021-08-10 DIAGNOSIS — Z113 Encounter for screening for infections with a predominantly sexual mode of transmission: Secondary | ICD-10-CM

## 2021-08-10 NOTE — Telephone Encounter (Signed)
Melissa Calling from [facility] is calling for clarification for Cytology - non gyn (Order [id number]) lab orders. Melissa was told by [facility] that this order is not for urine its for fluid. It will be rejected. Melissa looked it up states GC Chlamydia ? ?Please advise CB- [phone] ? ?

## 2021-08-12 ENCOUNTER — Other Ambulatory Visit: Payer: BC Managed Care – PPO

## 2021-08-12 DIAGNOSIS — E785 Hyperlipidemia, unspecified: Secondary | ICD-10-CM

## 2021-08-12 DIAGNOSIS — Z0000 Encounter for general adult medical examination without abnormal findings: Secondary | ICD-10-CM

## 2021-08-12 DIAGNOSIS — Z113 Encounter for screening for infections with a predominantly sexual mode of transmission: Secondary | ICD-10-CM

## 2021-08-14 LAB — GC/CHLAMYDIA PROBE AMP
Chlamydia trachomatis, NAA: NEGATIVE
Neisseria Gonorrhoeae by PCR: NEGATIVE

## 2021-08-18 LAB — HIV ANTIBODY (ROUTINE TESTING W REFLEX): HIV Screen 4th Generation wRfx: NONREACTIVE

## 2021-08-18 LAB — COMPREHENSIVE METABOLIC PANEL
ALT: 41 IU/L (ref 0–44)
AST: 21 IU/L (ref 0–40)
Albumin/Globulin Ratio: 2.4 — ABNORMAL HIGH (ref 1.2–2.2)
Albumin: 4.5 g/dL (ref 4.0–5.0)
Alkaline Phosphatase: 59 IU/L (ref 44–121)
BUN/Creatinine Ratio: 11 (ref 9–20)
BUN: 12 mg/dL (ref 6–20)
Bilirubin Total: 0.2 mg/dL (ref 0.0–1.2)
CO2: 18 mmol/L — ABNORMAL LOW (ref 20–29)
Calcium: 9.4 mg/dL (ref 8.7–10.2)
Chloride: 105 mmol/L (ref 96–106)
Creatinine, Ser: 1.08 mg/dL (ref 0.76–1.27)
Globulin, Total: 1.9 g/dL (ref 1.5–4.5)
Glucose: 96 mg/dL (ref 70–99)
Potassium: 4 mmol/L (ref 3.5–5.2)
Sodium: 143 mmol/L (ref 134–144)
Total Protein: 6.4 g/dL (ref 6.0–8.5)
eGFR: 90 mL/min/{1.73_m2} (ref >59)

## 2021-08-18 LAB — CBC WITH DIFFERENTIAL/PLATELET
Basophils Absolute: 0 10*3/uL (ref 0.0–0.2)
Basos: 1 %
EOS (ABSOLUTE): 0.1 10*3/uL (ref 0.0–0.4)
Eos: 1 %
Hematocrit: 46.7 % (ref 37.5–51.0)
Hemoglobin: 16.4 g/dL (ref 13.0–17.7)
Immature Grans (Abs): 0 10*3/uL (ref 0.0–0.1)
Immature Granulocytes: 0 %
Lymphocytes Absolute: 1.4 10*3/uL (ref 0.7–3.1)
Lymphs: 25 %
MCH: 33.1 pg — ABNORMAL HIGH (ref 26.6–33.0)
MCHC: 35.1 g/dL (ref 31.5–35.7)
MCV: 94 fL (ref 79–97)
Monocytes Absolute: 0.4 10*3/uL (ref 0.1–0.9)
Monocytes: 7 %
Neutrophils Absolute: 3.6 10*3/uL (ref 1.4–7.0)
Neutrophils: 66 %
Platelets: 288 10*3/uL (ref 150–450)
RBC: 4.95 x10E6/uL (ref 4.14–5.80)
RDW: 11.7 % (ref 11.6–15.4)
WBC: 5.5 10*3/uL (ref 3.4–10.8)

## 2021-08-18 LAB — LIPID PANEL
Chol/HDL Ratio: 4.8 ratio (ref 0.0–5.0)
Cholesterol, Total: 188 mg/dL (ref 100–199)
HDL: 39 mg/dL — ABNORMAL LOW (ref >39)
LDL Chol Calc (NIH): 129 mg/dL — ABNORMAL HIGH (ref 0–99)
Triglycerides: 110 mg/dL (ref 0–149)
VLDL Cholesterol Cal: 20 mg/dL (ref 5–40)

## 2021-08-18 LAB — RPR: RPR Ser Ql: NONREACTIVE

## 2022-01-19 DIAGNOSIS — Z713 Dietary counseling and surveillance: Secondary | ICD-10-CM | POA: Diagnosis not present
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
## 2022-01-28 ENCOUNTER — Encounter: Payer: Self-pay | Admitting: Family Medicine

## 2022-01-28 ENCOUNTER — Ambulatory Visit: Payer: BC Managed Care – PPO | Admitting: Family Medicine

## 2022-01-28 VITALS — BP 126/70 | HR 81 | Resp 16 | Ht 69.0 in | Wt 187.0 lb

## 2022-01-28 DIAGNOSIS — J3089 Other allergic rhinitis: Secondary | ICD-10-CM | POA: Diagnosis not present

## 2022-01-28 DIAGNOSIS — G43009 Migraine without aura, not intractable, without status migrainosus: Secondary | ICD-10-CM

## 2022-01-28 DIAGNOSIS — E785 Hyperlipidemia, unspecified: Secondary | ICD-10-CM | POA: Diagnosis not present

## 2022-01-28 DIAGNOSIS — J302 Other seasonal allergic rhinitis: Secondary | ICD-10-CM

## 2022-01-28 DIAGNOSIS — J4520 Mild intermittent asthma, uncomplicated: Secondary | ICD-10-CM | POA: Diagnosis not present

## 2022-01-28 DIAGNOSIS — M25811 Other specified joint disorders, right shoulder: Secondary | ICD-10-CM

## 2022-02-05 IMAGING — CR DG LUMBAR SPINE COMPLETE 4+V
1 series · 5 of 5 positions shown · non-contrast
Comparison: None.

CLINICAL DATA: Fall, back pain

EXAM:
LUMBAR SPINE - COMPLETE 4+ VIEW

[Series 1: dg lumbar spine complete 4 +v · 0.14mm/px · 5 of 5 slices shown]
[im 1/5]
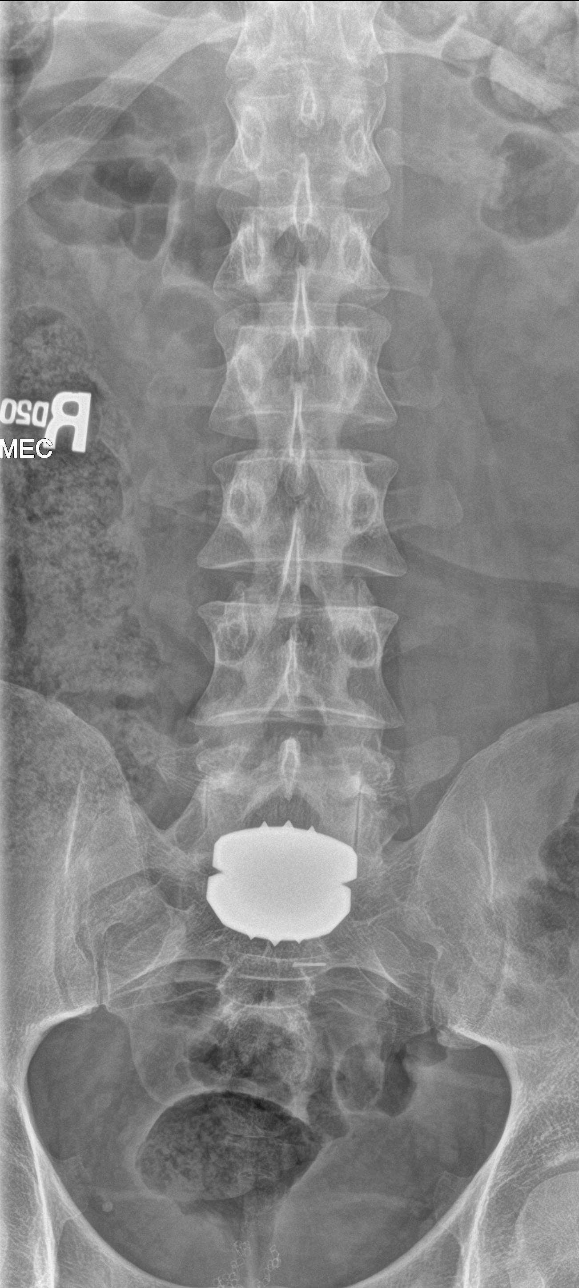
[im 2/5]
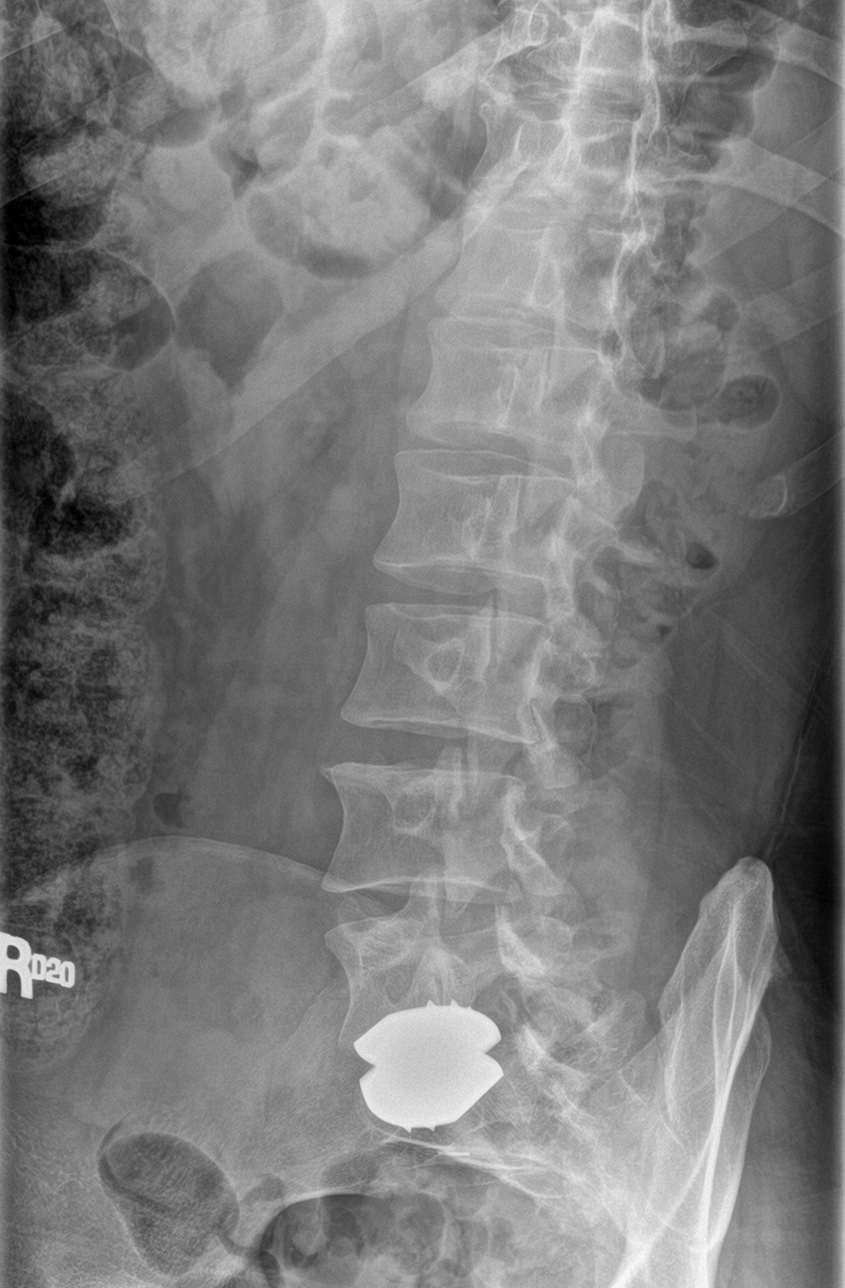
[im 3/5]
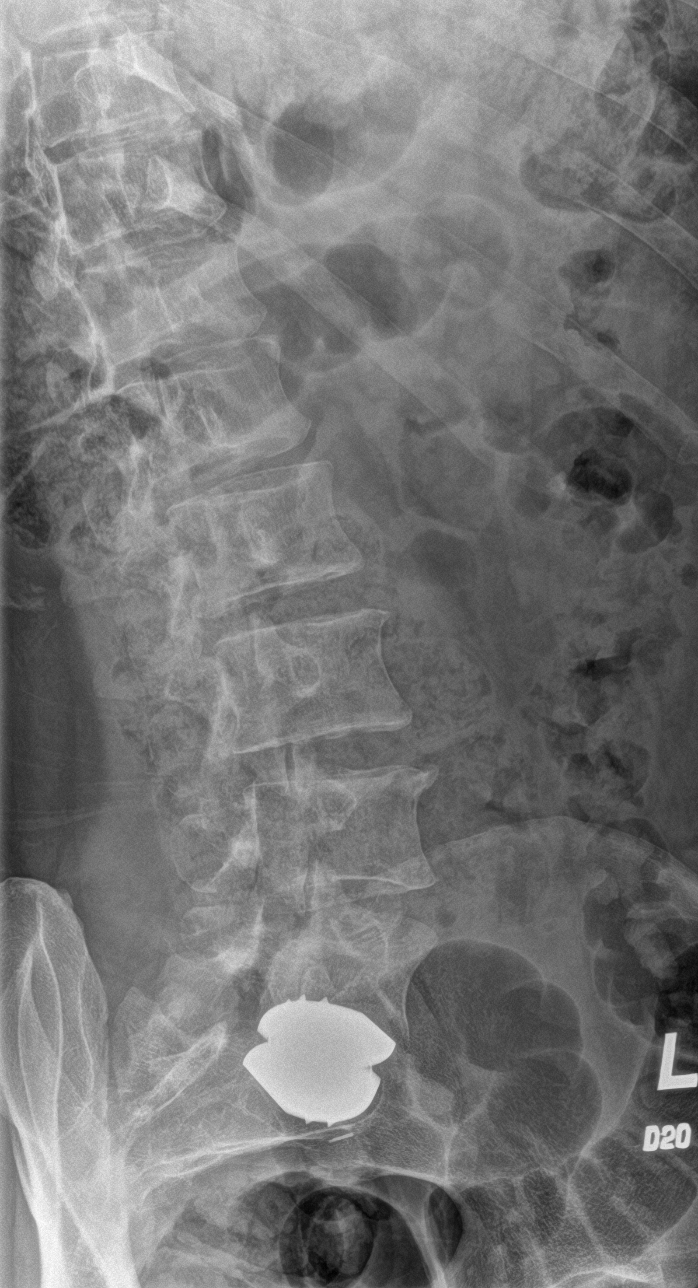
[im 4/5]
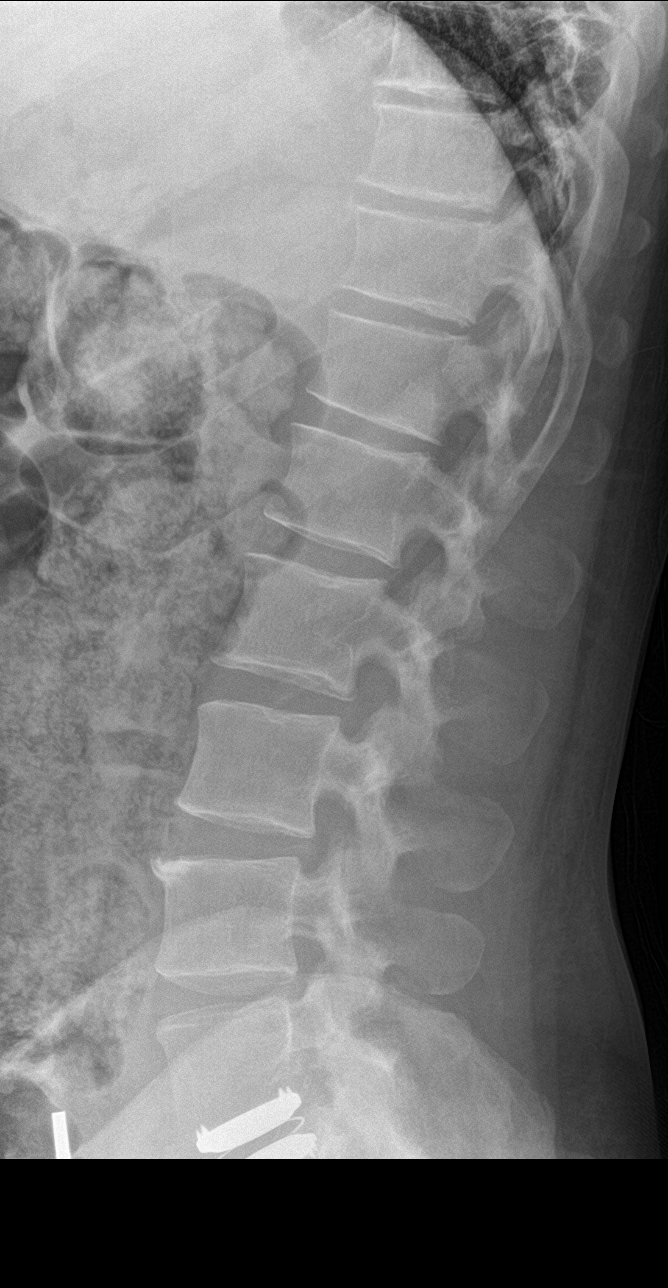
[im 5/5]
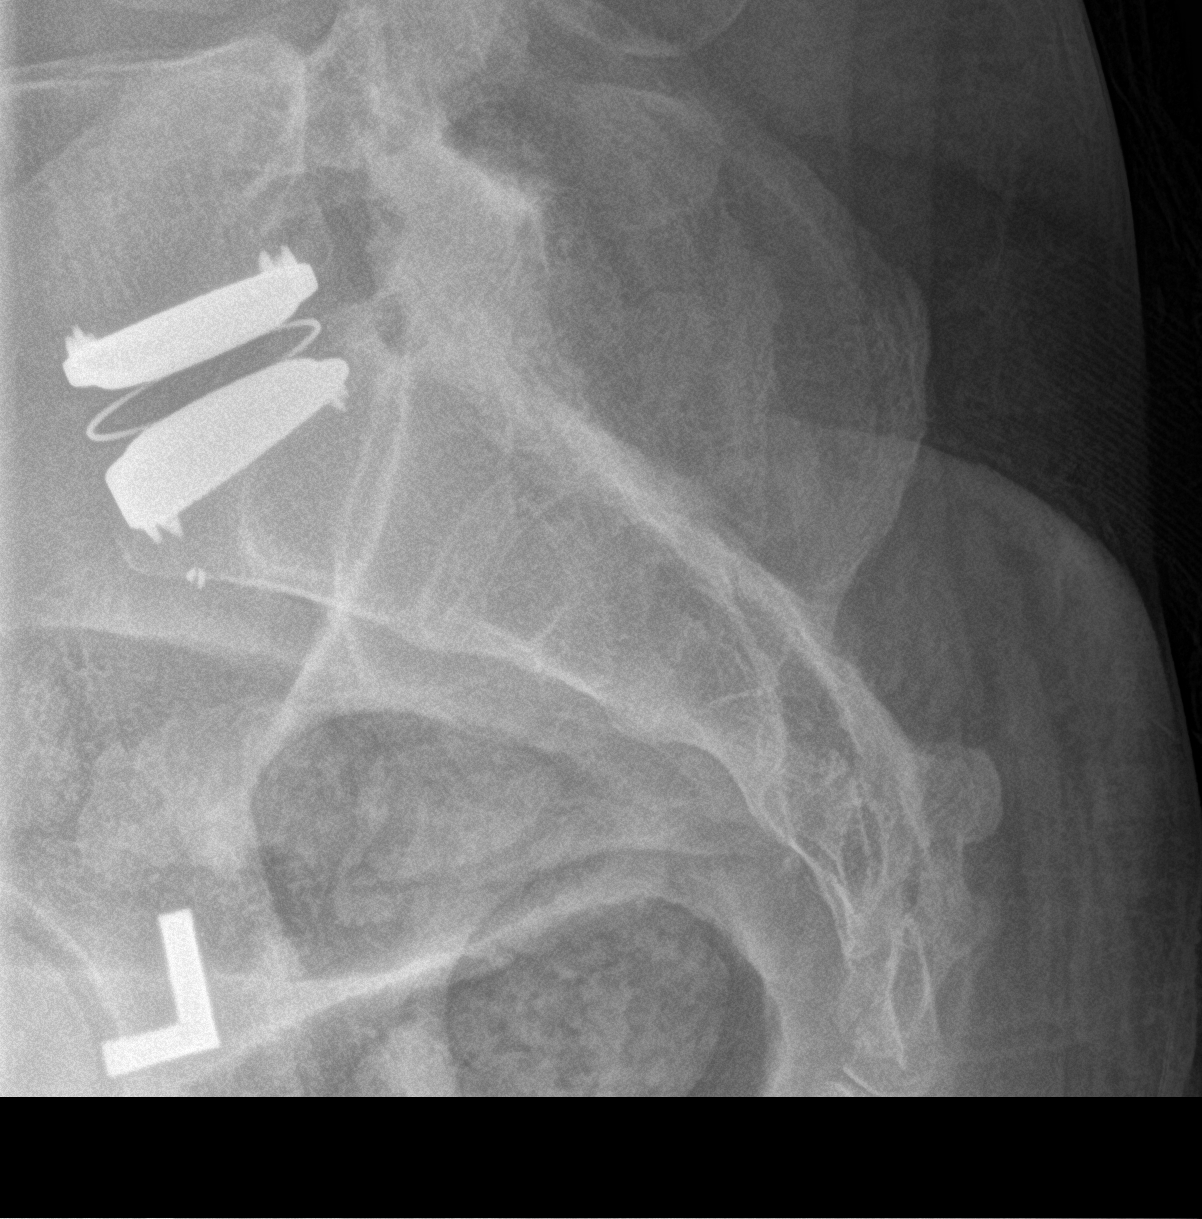

[5 of 5 positions shown; findings below may reference images not displayed]

FINDINGS: Vertebral body heights are maintained. No significant listhesis.
Interbody fusion implant is present at L5-S1. Mild multilevel disc
space narrowing with small endplate osteophytes. The mild multilevel
facet hypertrophy.
IMPRESSION: No acute fracture.

## 2022-04-23 ENCOUNTER — Telehealth: Payer: Self-pay | Admitting: Neurology

## 2022-04-23 NOTE — Telephone Encounter (Signed)
Pt said, Head started hurting while at concert on 58/2. Headache went away after taking a nap. Have happened a couple of times in October.

## 2022-05-17 DIAGNOSIS — H538 Other visual disturbances: Secondary | ICD-10-CM | POA: Diagnosis not present

## 2022-05-17 DIAGNOSIS — H52223 Regular astigmatism, bilateral: Secondary | ICD-10-CM | POA: Diagnosis not present

## 2022-05-17 DIAGNOSIS — H52221 Regular astigmatism, right eye: Secondary | ICD-10-CM | POA: Diagnosis not present

## 2022-05-17 DIAGNOSIS — Z8669 Personal history of other diseases of the nervous system and sense organs: Secondary | ICD-10-CM | POA: Diagnosis not present

## 2022-05-18 DIAGNOSIS — Z8669 Personal history of other diseases of the nervous system and sense organs: Secondary | ICD-10-CM | POA: Diagnosis not present

## 2022-05-18 DIAGNOSIS — H52223 Regular astigmatism, bilateral: Secondary | ICD-10-CM | POA: Diagnosis not present

## 2022-06-29 ENCOUNTER — Ambulatory Visit: Payer: BC Managed Care – PPO | Admitting: Neurology

## 2022-06-29 ENCOUNTER — Encounter: Payer: Self-pay | Admitting: Neurology

## 2022-06-29 VITALS — BP 112/67 | HR 69 | Ht 69.0 in | Wt 184.5 lb

## 2022-06-29 DIAGNOSIS — G43709 Chronic migraine without aura, not intractable, without status migrainosus: Secondary | ICD-10-CM
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
## 2022-07-15 DIAGNOSIS — J069 Acute upper respiratory infection, unspecified: Secondary | ICD-10-CM | POA: Diagnosis not present

## 2022-07-20 DIAGNOSIS — B308 Other viral conjunctivitis: Secondary | ICD-10-CM | POA: Diagnosis not present

## 2022-08-03 ENCOUNTER — Encounter: Payer: BC Managed Care – PPO | Admitting: Family Medicine
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
## 2022-08-11 NOTE — Patient Instructions (Signed)

## 2022-08-12 ENCOUNTER — Encounter: Payer: Self-pay | Admitting: Family Medicine

## 2022-08-12 ENCOUNTER — Ambulatory Visit (INDEPENDENT_AMBULATORY_CARE_PROVIDER_SITE_OTHER): Payer: BC Managed Care – PPO | Admitting: Family Medicine

## 2022-08-12 VITALS — BP 116/70 | HR 72 | Resp 16 | Ht 69.0 in | Wt 185.0 lb

## 2022-08-12 DIAGNOSIS — Z0000 Encounter for general adult medical examination without abnormal findings: Secondary | ICD-10-CM

## 2022-08-12 DIAGNOSIS — Z131 Encounter for screening for diabetes mellitus: Secondary | ICD-10-CM | POA: Diagnosis not present

## 2022-08-12 DIAGNOSIS — E785 Hyperlipidemia, unspecified: Secondary | ICD-10-CM | POA: Diagnosis not present

## 2022-08-12 DIAGNOSIS — Z113 Encounter for screening for infections with a predominantly sexual mode of transmission: Secondary | ICD-10-CM | POA: Diagnosis not present

## 2022-08-12 DIAGNOSIS — R059 Cough, unspecified: Secondary | ICD-10-CM

## 2022-08-25 ENCOUNTER — Emergency Department
Admission: EM | Admit: 2022-08-25 | Discharge: 2022-08-25 | Disposition: A | Discharge status: Home / Self Care | Payer: BC Managed Care – PPO | Attending: Emergency Medicine | Admitting: Emergency Medicine

## 2022-08-25 ENCOUNTER — Other Ambulatory Visit: Payer: Self-pay

## 2022-08-25 ENCOUNTER — Emergency Department: Payer: BC Managed Care – PPO

## 2022-08-25 ENCOUNTER — Ambulatory Visit: Payer: Self-pay

## 2022-08-25 ENCOUNTER — Encounter: Payer: Self-pay | Admitting: *Deleted

## 2022-08-25 DIAGNOSIS — S060X0A Concussion without loss of consciousness, initial encounter: Secondary | ICD-10-CM | POA: Insufficient documentation

## 2022-08-25 DIAGNOSIS — S0990XA Unspecified injury of head, initial encounter: Secondary | ICD-10-CM

## 2022-08-25 DIAGNOSIS — J45909 Unspecified asthma, uncomplicated: Secondary | ICD-10-CM | POA: Diagnosis not present

## 2022-08-25 DIAGNOSIS — W208XXA Other cause of strike by thrown, projected or falling object, initial encounter: Secondary | ICD-10-CM | POA: Diagnosis not present

## 2022-08-25 DIAGNOSIS — S199XXA Unspecified injury of neck, initial encounter: Secondary | ICD-10-CM | POA: Diagnosis not present

## 2022-08-25 DIAGNOSIS — Y990 Civilian activity done for income or pay: Secondary | ICD-10-CM | POA: Insufficient documentation

## 2022-08-25 MED ORDER — NAPROXEN 500 MG PO TABS: 500.0000 mg | ORAL_TABLET | Freq: Two times a day (BID) | ORAL | 0 refills | Status: AC

## 2022-08-25 NOTE — Telephone Encounter (Signed)
  Chief Complaint: concussion  Symptoms: had planks of wood hit in head and had HA intermittent and pt feels delayed or off today, nausea Frequency: yesterday morning was moving stuff at work  Pertinent Negatives: Patient denies dizziness or blurred vision or vomiting  Disposition: ED /[] Urgent Care (no appt availability in office) / Appointment(In office/virtual)/  [facility]/ Home Care/ Refused Recommended Disposition /[] [facility]/  Follow-up with PCP Additional Notes: pt concerned he may have a concussion. He had one during COVID and states took like 18 months to recover but pt states he just feels off and if thinks too hard makes his head hurt and has had staff tell him he didn't or act normal. Advised pt to go to ED for evaluation. Pt asked if he can take Sumatriptan prior to going to ED, advised him to not take that and only use Tylenol or Ibuprofen if needed, pt states he had Aleve so was going to take that.   Summary: Pt stated he may have suffered a concussion   Pt stated he may have suffered a concussion and he is feeling worse today than yesterday. Pt requests call back. Cb# [phone]         Reason for Disposition  [1] SEVERE headache AND [2] not improved 2 hours after pain medicine/ice packs  Answer Assessment - Initial Assessment Questions 1. MECHANISM: "How did the injury happen?" For falls, ask: "What height did you fall from?" and "What surface did you fall against?"      Was moving stuff at work and had some planks of wood hit him in his head  2. ONSET: "When did the injury happen?" (Minutes or hours ago)      Yesterday morning  3. NEUROLOGIC SYMPTOMS: "Was there any loss of consciousness?" "Are there any other neurological symptoms?"      Unsure if fell to ground but staff said he didn't  4. MENTAL STATUS: "Does the person know who they are, who you are, and where they are?"      Oriented just feeling delayed and unable to clearly   5. LOCATION: "What part of the head was hit?"      Head 8. PAIN: "Is there any pain?" If Yes, ask: "How bad is it?"  (e.g., Scale 1-10; or mild, moderate, severe)     HA 4/10, comes and goes  10. OTHER SYMPTOMS: "Do you have any other symptoms?" (e.g., neck pain, vomiting)       no  Protocols used: Head Injury-A-AH
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
## 2022-08-25 NOTE — ED Triage Notes (Signed)
Pt reports wood planks fell onto his head yesterday at work.  Maybe WC, pt is owner.  Pt reports no loc. No lac or abrasions.  Pt reports neck pain.  No back pain.  No n/v.  Pt reports memory loss from yesterday.  Pt alert  speech clear.

## 2022-08-25 NOTE — Discharge Instructions (Signed)
Please practice brain rest as we discussed.  Please follow-up with your neurologist.  Please return for any new, worsening, or change in symptoms or other concerns.  It was a pleasure caring for you today.

## 2022-09-03 ENCOUNTER — Other Ambulatory Visit (INDEPENDENT_AMBULATORY_CARE_PROVIDER_SITE_OTHER): Payer: Self-pay

## 2022-09-03 DIAGNOSIS — Z131 Encounter for screening for diabetes mellitus: Secondary | ICD-10-CM

## 2022-09-03 DIAGNOSIS — R059 Cough, unspecified: Secondary | ICD-10-CM

## 2022-09-03 DIAGNOSIS — Z0000 Encounter for general adult medical examination without abnormal findings: Secondary | ICD-10-CM

## 2022-09-03 DIAGNOSIS — Z113 Encounter for screening for infections with a predominantly sexual mode of transmission: Secondary | ICD-10-CM

## 2022-09-03 DIAGNOSIS — E785 Hyperlipidemia, unspecified: Secondary | ICD-10-CM

## 2022-09-03 NOTE — Progress Notes (Signed)
Labs drawn per orders

## 2022-09-04 LAB — SPECIMEN STATUS

## 2022-09-04 LAB — LIPID PANEL

## 2022-09-06 LAB — LIPID PANEL
HDL: 39 mg/dL — ABNORMAL LOW (ref >39)
VLDL Cholesterol Cal: 14 mg/dL (ref 5–40)

## 2022-09-06 LAB — RPR: RPR Ser Ql: NONREACTIVE

## 2022-09-09 LAB — COMPREHENSIVE METABOLIC PANEL
ALT: 51 IU/L — ABNORMAL HIGH (ref 0–44)
AST: 31 IU/L (ref 0–40)
Albumin/Globulin Ratio: 1.9 (ref 1.2–2.2)
Albumin: 4.2 g/dL (ref 4.1–5.1)
Alkaline Phosphatase: 62 IU/L (ref 44–121)
BUN/Creatinine Ratio: 10 (ref 9–20)
BUN: 11 mg/dL (ref 6–24)
Bilirubin Total: <0.2 mg/dL (ref 0.0–1.2)
CO2: 22 mmol/L (ref 20–29)
Calcium: 9 mg/dL (ref 8.7–10.2)
Chloride: 102 mmol/L (ref 96–106)
Creatinine, Ser: 1.12 mg/dL (ref 0.76–1.27)
Globulin, Total: 2.2 g/dL (ref 1.5–4.5)
Glucose: 85 mg/dL (ref 70–99)
Potassium: 4.2 mmol/L (ref 3.5–5.2)
Sodium: 138 mmol/L (ref 134–144)
Total Protein: 6.4 g/dL (ref 6.0–8.5)
eGFR: 85 mL/min/{1.73_m2} (ref >59)

## 2022-09-09 LAB — HEMOGLOBIN A1C
Est. average glucose Bld gHb Est-mCnc: 108 mg/dL
Hgb A1c MFr Bld: 5.4 % (ref 4.8–5.6)

## 2022-09-09 LAB — FOOD ALLERGY PROFILE
Allergen Corn, IgE: <0.1 kU/L
Clam IgE: <0.1 kU/L
Codfish IgE: <0.1 kU/L
Egg White IgE: <0.1 kU/L
Milk IgE: <0.1 kU/L
Peanut IgE: <0.1 kU/L
Scallop IgE: <0.1 kU/L
Sesame Seed IgE: <0.1 kU/L
Shrimp IgE: <0.1 kU/L
Soybean IgE: <0.1 kU/L
Walnut IgE: <0.1 kU/L
Wheat IgE: <0.1 kU/L

## 2022-09-09 LAB — CBC WITH DIFFERENTIAL/PLATELET
Basophils Absolute: 0 10*3/uL (ref 0.0–0.2)
Basos: 1 %
EOS (ABSOLUTE): 0.1 10*3/uL (ref 0.0–0.4)
Eos: 3 %
Hematocrit: 44.6 % (ref 37.5–51.0)
Hemoglobin: 15.2 g/dL (ref 13.0–17.7)
Immature Grans (Abs): 0 10*3/uL (ref 0.0–0.1)
Immature Granulocytes: 0 %
Lymphocytes Absolute: 1.5 10*3/uL (ref 0.7–3.1)
Lymphs: 30 %
MCH: 32.3 pg (ref 26.6–33.0)
MCHC: 34.1 g/dL (ref 31.5–35.7)
MCV: 95 fL (ref 79–97)
Monocytes Absolute: 0.5 10*3/uL (ref 0.1–0.9)
Monocytes: 9 %
Neutrophils Absolute: 2.9 10*3/uL (ref 1.4–7.0)
Neutrophils: 57 %
Platelets: 262 10*3/uL (ref 150–450)
RBC: 4.71 x10E6/uL (ref 4.14–5.80)
RDW: 11.7 % (ref 11.6–15.4)
WBC: 5.1 10*3/uL (ref 3.4–10.8)

## 2022-09-09 LAB — HIV ANTIBODY (ROUTINE TESTING W REFLEX): HIV Screen 4th Generation wRfx: NONREACTIVE

## 2022-09-09 LAB — LIPID PANEL
Chol/HDL Ratio: 4.6 ratio (ref 0.0–5.0)
Cholesterol, Total: 181 mg/dL (ref 100–199)
LDL Chol Calc (NIH): 128 mg/dL — ABNORMAL HIGH (ref 0–99)

## 2022-09-09 NOTE — Progress Notes (Signed)
Please find labs as ordered.

## 2022-10-12 DIAGNOSIS — D2272 Melanocytic nevi of left lower limb, including hip: Secondary | ICD-10-CM | POA: Diagnosis not present

## 2022-10-12 DIAGNOSIS — D2261 Melanocytic nevi of right upper limb, including shoulder: Secondary | ICD-10-CM | POA: Diagnosis not present

## 2022-10-12 DIAGNOSIS — D225 Melanocytic nevi of trunk: Secondary | ICD-10-CM | POA: Diagnosis not present

## 2022-10-12 DIAGNOSIS — D2262 Melanocytic nevi of left upper limb, including shoulder: Secondary | ICD-10-CM | POA: Diagnosis not present

## 2022-11-17 ENCOUNTER — Encounter: Payer: Self-pay | Admitting: Neurology

## 2022-11-18 ENCOUNTER — Telehealth: Payer: Self-pay

## 2022-11-18 DIAGNOSIS — G43709 Chronic migraine without aura, not intractable, without status migrainosus: Secondary | ICD-10-CM

## 2022-11-18 MED ORDER — SUMATRIPTAN SUCCINATE 50 MG PO TABS
ORAL_TABLET | ORAL | 11 refills | Status: DC
Start: 2022-11-18 — End: 2023-08-01

## 2022-11-18 NOTE — Telephone Encounter (Signed)
Call to patient that reports 2 migraines since concussion in Aaron Atkins. He does state stress can make him have "melt downs" and driving long distances are difficult to him. No other symptoms reported and is doing much better. He requests refill if sumatriptan. Advised to will send to Dr. Terrace Arabia for recommendations/advice. Patient in agreement to respond via MyChart.

## 2022-11-18 NOTE — Telephone Encounter (Signed)
My Chart message sent

## 2022-11-18 NOTE — Telephone Encounter (Signed)
Meds ordered this encounter  Medications   SUMAtriptan (IMITREX) 50 MG tablet    Sig: May repeat in 2 hours if headache persists or recurs.    Dispense:  10 tablet    Refill:  11

## 2023-05-15 DIAGNOSIS — S060XAA Concussion with loss of consciousness status unknown, initial encounter: Secondary | ICD-10-CM | POA: Insufficient documentation

## 2023-05-29 ENCOUNTER — Emergency Department
Admission: EM | Admit: 2023-05-29 | Discharge: 2023-05-29 | Disposition: A | Discharge status: Home / Self Care | Payer: BC Managed Care – PPO | Attending: Emergency Medicine | Admitting: Emergency Medicine

## 2023-05-29 ENCOUNTER — Emergency Department: Payer: BC Managed Care – PPO

## 2023-05-29 ENCOUNTER — Other Ambulatory Visit: Payer: Self-pay

## 2023-05-29 DIAGNOSIS — F0781 Postconcussional syndrome: Secondary | ICD-10-CM | POA: Insufficient documentation

## 2023-05-29 DIAGNOSIS — H538 Other visual disturbances: Secondary | ICD-10-CM | POA: Diagnosis not present

## 2023-05-29 DIAGNOSIS — G44309 Post-traumatic headache, unspecified, not intractable: Secondary | ICD-10-CM | POA: Diagnosis not present

## 2023-05-29 DIAGNOSIS — R4182 Altered mental status, unspecified: Secondary | ICD-10-CM | POA: Diagnosis not present

## 2023-05-29 NOTE — ED Triage Notes (Signed)
Pt sts that he hit his head while in [place]. Pt sts that has been having social cues issues and short term mental loss for the last two weeks.

## 2023-05-29 NOTE — ED Provider Notes (Signed)
[facility] Emergency Department Provider Note     Event Date/Time   First MD Initiated Contact with Patient 05/29/23 1742     (approximate)   History   Head Injury   HPI  Aaron Atkins is a 43 y.o. male complains of symptoms concerning for possible concussion.  Patient would endorse initial incident happened about 2 weeks ago when he went to [place].  He would endorse he did hit his head on the lower than expected doorjamb's on 3 separate occasions.  He denies any frank syncope.  He would endorse intermit episodes of fogginess, blurry vision, and fatigue.  Patient denies any distal paresthesias, vertigo, dizziness, tinnitus, hearing loss.     Physical Exam   Triage Vital Signs: ED Triage Vitals [05/29/23 1601]  Encounter Vitals Group     BP (!) 143/115     Systolic BP Percentile      Diastolic BP Percentile      Pulse Rate 87     Resp 17     Temp 97.8 F (36.6 C)     Temp Source Oral     SpO2 96 %     Weight 180 lb (81.6 kg)     Height 5\' 9"  (1.753 m)     Head Circumference      Peak Flow      Pain Score 5     Pain Loc      Pain Education      Exclude from Growth Chart     Most recent vital signs: Vitals:   05/29/23 1601  BP: (!) 143/115  Pulse: 87  Resp: 17  Temp: 97.8 F (36.6 C)  SpO2: 96%    General Awake, no distress. NAD HEENT NCAT. PERRL. EOMI. No rhinorrhea. Mucous membranes are moist.  CV:  Good peripheral perfusion. RRR RESP:  Normal effort. CTA ABD:  No distention.  NEURO: Cranial nerves II to XII grossly intact   ED Results / Procedures / Treatments   Labs (all labs ordered are listed, but only abnormal results are displayed) Labs Reviewed - No data to display   EKG   RADIOLOGY  I personally viewed and evaluated these images as part of my medical decision making, as well as reviewing the written report by the radiologist.  ED Provider Interpretation: No acute findings  CT HEAD WO CONTRAST  ( ) Result Date: 05/29/2023 CLINICAL DATA:  Hit head while in [place].  Altered mental status. EXAM: CT HEAD WITHOUT CONTRAST TECHNIQUE: Contiguous axial images were obtained from the base of the skull through the vertex without intravenous contrast. RADIATION DOSE REDUCTION: This exam was performed according to the departmental dose-optimization program which includes automated exposure control, adjustment of the mA and/or kV according to patient size and/or use of iterative reconstruction technique. COMPARISON:  08/25/2022. FINDINGS: Brain: No evidence of acute infarction, hemorrhage, hydrocephalus, extra-axial collection or mass lesion/mass effect. Vascular: No hyperdense vessel or unexpected calcification. Skull: Normal. Negative for fracture or focal lesion. Sinuses/Orbits: Globes and orbits are unremarkable. Visualized sinuses are clear. Other: None. IMPRESSION: Normal head CT. Electronically Signed   By: Amie Portland M.D.   On: 05/29/2023 17:21     PROCEDURES:  Critical Care performed: No  Procedures   MEDICATIONS ORDERED IN ED: Medications - No data to display   IMPRESSION / MDM / ASSESSMENT AND PLAN / ED COURSE  I reviewed the triage vital signs and the nursing notes.  Differential diagnosis includes, but is not limited to, intracranial hemorrhage, meningitis/encephalitis, previous head trauma, cavernous venous thrombosis, tension headache, temporal arteritis, migraine or migraine equivalent, idiopathic intracranial hypertension, and non-specific headache.  Patient's presentation is most consistent with acute complicated illness / injury requiring diagnostic workup.  Patient's diagnosis is consistent with postconcussive syndrome.  Patient with reassuring exam and workup at this time.  No acute neuro muscle deficits are appreciated.  Patient will be discharged home with instruction to take his home medications as prescribed. Patient is to follow up with  his PCP or neurologist as discussed, as needed or otherwise directed. Patient is given ED precautions to return to the ED for any worsening or new symptoms.   FINAL CLINICAL IMPRESSION(S) / ED DIAGNOSES   Final diagnoses:  Post concussion syndrome     Rx / DC Orders   ED Discharge Orders     None        Note:  This document was prepared using Dragon voice recognition software and may include unintentional dictation errors.    Lissa Hoard, PA-C 05/29/23 2328    Janith Lima, MD 05/30/23 [phone]

## 2023-05-29 NOTE — Discharge Instructions (Signed)
Your exam, labs, and CT scan were normal and reassuring at this time.  No signs of a serious closed head injury.  Symptoms are likely consistent with a mild posttraumatic headache and or mild concussion.  Take the prescription meds as directed.  Follow-up with your primary provider for ongoing concerns.  Return to the ED if needed.
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
## 2023-06-03 ENCOUNTER — Telehealth: Payer: Self-pay

## 2023-06-03 NOTE — Progress Notes (Signed)
Transition Care Management Follow-up Telephone Call Date of discharge and from where:  1/19 How have you been since you were released from the hospital?Patient has not followed up with providers  Any questions or concerns? No  Items Reviewed: Did the pt receive and understand the discharge instructions provided? Yes  Medications obtained and verified? No  Other? No  Any new allergies since your discharge? No  Dietary orders reviewed? No Do you have support at home? Yes    Follow up appointments reviewed:  PCP Hospital f/u appt confirmed? No  Scheduled to see  on  @ . Specialist Hospital f/u appt confirmed? No  Scheduled to see  on  @ . Are transportation arrangements needed? No  If their condition worsens, is the pt aware to call PCP or go to the Emergency Dept.? Yes Was the patient provided with contact information for the PCP's office or ED? Yes Was to pt encouraged to call back with questions or concerns? Yes

## 2023-06-06 ENCOUNTER — Telehealth: Payer: Self-pay | Admitting: Neurology

## 2023-06-06 NOTE — Telephone Encounter (Signed)
Pt scheduled appt. Wait listed

## 2023-08-01 ENCOUNTER — Ambulatory Visit (INDEPENDENT_AMBULATORY_CARE_PROVIDER_SITE_OTHER): Admitting: Neurology

## 2023-08-01 ENCOUNTER — Encounter: Payer: Self-pay | Admitting: Neurology

## 2023-08-01 VITALS — BP 112/68 | HR 70 | Ht 69.0 in | Wt 183.0 lb

## 2023-08-01 DIAGNOSIS — G43709 Chronic migraine without aura, not intractable, without status migrainosus: Secondary | ICD-10-CM

## 2023-08-01 DIAGNOSIS — R42 Dizziness and giddiness: Secondary | ICD-10-CM | POA: Insufficient documentation

## 2023-08-01 MED ORDER — SUMATRIPTAN SUCCINATE 100 MG PO TABS: ORAL_TABLET | ORAL | 11 refills | Status: DC

## 2023-08-01 MED ORDER — AMITRIPTYLINE HCL 25 MG PO TABS: 50.0000 mg | ORAL_TABLET | Freq: Every day | ORAL | 11 refills | Status: DC
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
## 2023-08-02 ENCOUNTER — Ambulatory Visit: Admitting: Neurology

## 2023-08-02 ENCOUNTER — Telehealth: Payer: Self-pay | Admitting: Neurology

## 2023-08-02 DIAGNOSIS — R4182 Altered mental status, unspecified: Secondary | ICD-10-CM

## 2023-08-02 DIAGNOSIS — G43709 Chronic migraine without aura, not intractable, without status migrainosus: Secondary | ICD-10-CM

## 2023-08-02 DIAGNOSIS — R42 Dizziness and giddiness: Secondary | ICD-10-CM

## 2023-08-02 LAB — CBC WITH DIFFERENTIAL/PLATELET
Basophils Absolute: 0 10*3/uL (ref 0.0–0.2)
Basos: 1 %
EOS (ABSOLUTE): 0.2 10*3/uL (ref 0.0–0.4)
Eos: 3 %
Hematocrit: 45.3 % (ref 37.5–51.0)
Hemoglobin: 15.6 g/dL (ref 13.0–17.7)
Immature Grans (Abs): 0 10*3/uL (ref 0.0–0.1)
Immature Granulocytes: 0 %
Lymphocytes Absolute: 1.8 10*3/uL (ref 0.7–3.1)
Lymphs: 31 %
MCH: 32.4 pg (ref 26.6–33.0)
MCHC: 34.4 g/dL (ref 31.5–35.7)
MCV: 94 fL (ref 79–97)
Monocytes Absolute: 0.5 10*3/uL (ref 0.1–0.9)
Monocytes: 8 %
Neutrophils Absolute: 3.3 10*3/uL (ref 1.4–7.0)
Neutrophils: 57 %
Platelets: 278 10*3/uL (ref 150–450)
RBC: 4.82 x10E6/uL (ref 4.14–5.80)
RDW: 11.8 % (ref 11.6–15.4)
WBC: 5.8 10*3/uL (ref 3.4–10.8)

## 2023-08-02 LAB — COMPREHENSIVE METABOLIC PANEL
ALT: 71 IU/L — ABNORMAL HIGH (ref 0–44)
AST: 33 IU/L (ref 0–40)
Albumin: 4.6 g/dL (ref 4.1–5.1)
Alkaline Phosphatase: 69 IU/L (ref 44–121)
BUN/Creatinine Ratio: 13 (ref 9–20)
BUN: 15 mg/dL (ref 6–24)
Bilirubin Total: 0.4 mg/dL (ref 0.0–1.2)
CO2: 22 mmol/L (ref 20–29)
Calcium: 9.6 mg/dL (ref 8.7–10.2)
Chloride: 102 mmol/L (ref 96–106)
Creatinine, Ser: 1.2 mg/dL (ref 0.76–1.27)
Globulin, Total: 2.4 g/dL (ref 1.5–4.5)
Glucose: 82 mg/dL (ref 70–99)
Potassium: 4.3 mmol/L (ref 3.5–5.2)
Sodium: 139 mmol/L (ref 134–144)
Total Protein: 7 g/dL (ref 6.0–8.5)
eGFR: 78 mL/min/{1.73_m2} (ref >59)

## 2023-08-02 LAB — TSH: TSH: 1.58 u[IU]/mL (ref 0.450–4.500)

## 2023-08-02 LAB — VITAMIN B12: Vitamin B-12: 305 pg/mL (ref 232–1245)

## 2023-08-02 MED ORDER — SUMATRIPTAN SUCCINATE 100 MG PO TABS: ORAL_TABLET | ORAL | 11 refills | Status: DC

## 2023-08-02 MED ORDER — AMITRIPTYLINE HCL 25 MG PO TABS: 50.0000 mg | ORAL_TABLET | Freq: Every day | ORAL | 0 refills | Status: DC

## 2023-08-02 NOTE — Telephone Encounter (Signed)
 Call to patient, advised meds were originally sent to [facility] but resending to [facility]. Review medication doses and patient verbalized understanding.

## 2023-08-02 NOTE — Telephone Encounter (Signed)
 Pt was here for EEG, said when here yesterday, meds never called in. Needs Amitriptilin was 50 mg but cutting to (2) 25mg ?? Also needs Sumatriptan 100mg  cutting to (2) 50mg ?? Pharmacy is [facility] [address] [place]. Please let him know when meds called in.

## 2023-08-02 NOTE — Addendum Note (Signed)
 Addended by: Lenn Cal on: 08/02/2023 03:13 PM   Modules accepted: Orders

## 2023-08-03 ENCOUNTER — Encounter: Payer: Self-pay | Admitting: Neurology

## 2023-08-09 ENCOUNTER — Encounter: Payer: Self-pay | Admitting: Neurology

## 2023-08-09 ENCOUNTER — Telehealth: Payer: Self-pay | Admitting: Neurology

## 2023-08-09 NOTE — Telephone Encounter (Signed)
 [facility] calling to check on request for medical records. Had faxed request to [phone], now refaxing [phone]

## 2023-08-10 ENCOUNTER — Other Ambulatory Visit: Payer: Self-pay

## 2023-08-10 MED ORDER — AMITRIPTYLINE HCL 25 MG PO TABS: 50.0000 mg | ORAL_TABLET | Freq: Every day | ORAL | 0 refills | Status: DC

## 2023-08-15 ENCOUNTER — Encounter: Payer: BC Managed Care – PPO | Admitting: Family Medicine

## 2023-08-15 ENCOUNTER — Encounter: Payer: Self-pay | Admitting: Neurology
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
## 2023-08-16 ENCOUNTER — Encounter: Payer: Self-pay | Admitting: Family Medicine

## 2023-08-16 ENCOUNTER — Ambulatory Visit (INDEPENDENT_AMBULATORY_CARE_PROVIDER_SITE_OTHER): Admitting: Family Medicine

## 2023-08-16 ENCOUNTER — Telehealth: Payer: Self-pay | Admitting: Anesthesiology

## 2023-08-16 VITALS — BP 122/76 | HR 83 | Resp 16 | Ht 69.0 in | Wt 187.7 lb

## 2023-08-16 DIAGNOSIS — Z0000 Encounter for general adult medical examination without abnormal findings: Secondary | ICD-10-CM
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
## 2023-08-16 NOTE — Telephone Encounter (Signed)
 Order has been emailed to [facility].

## 2023-08-24 DIAGNOSIS — H52223 Regular astigmatism, bilateral: Secondary | ICD-10-CM | POA: Diagnosis not present

## 2023-08-24 DIAGNOSIS — Z87820 Personal history of traumatic brain injury: Secondary | ICD-10-CM | POA: Diagnosis not present

## 2023-08-24 DIAGNOSIS — Z8669 Personal history of other diseases of the nervous system and sense organs: Secondary | ICD-10-CM | POA: Diagnosis not present

## 2023-08-24 DIAGNOSIS — H524 Presbyopia: Secondary | ICD-10-CM | POA: Diagnosis not present

## 2023-08-30 ENCOUNTER — Ambulatory Visit: Payer: BC Managed Care – PPO | Admitting: Neurology

## 2023-09-02 DIAGNOSIS — R4701 Aphasia: Secondary | ICD-10-CM | POA: Diagnosis not present

## 2023-09-02 DIAGNOSIS — G40909 Epilepsy, unspecified, not intractable, without status epilepticus: Secondary | ICD-10-CM

## 2023-09-07 ENCOUNTER — Encounter: Payer: Self-pay | Admitting: Family Medicine

## 2023-09-08 ENCOUNTER — Encounter (INDEPENDENT_AMBULATORY_CARE_PROVIDER_SITE_OTHER): Payer: Self-pay | Admitting: Neurology

## 2023-09-08 DIAGNOSIS — G43709 Chronic migraine without aura, not intractable, without status migrainosus: Secondary | ICD-10-CM | POA: Diagnosis not present

## 2023-09-12 ENCOUNTER — Encounter: Payer: Self-pay | Admitting: Neurology

## 2023-09-12 ENCOUNTER — Encounter (INDEPENDENT_AMBULATORY_CARE_PROVIDER_SITE_OTHER): Payer: Self-pay | Admitting: Neurology

## 2023-09-12 ENCOUNTER — Telehealth: Payer: Self-pay | Admitting: Neurology

## 2023-09-12 DIAGNOSIS — R42 Dizziness and giddiness: Secondary | ICD-10-CM

## 2023-09-12 DIAGNOSIS — G43709 Chronic migraine without aura, not intractable, without status migrainosus: Secondary | ICD-10-CM

## 2023-09-12 MED ORDER — DIVALPROEX SODIUM ER 500 MG PO TB24: 500.0000 mg | ORAL_TABLET | Freq: Every day | ORAL | 11 refills | Status: DC

## 2023-09-12 NOTE — Telephone Encounter (Signed)
 Pt has been scheduled with Jeanmarie Millet.

## 2023-09-12 NOTE — Telephone Encounter (Signed)
 Give him a follow up appt with me or NP in 2-3 months

## 2023-09-12 NOTE — Telephone Encounter (Signed)
 I called patient,   EEG was abnormal, there was intermittent left frontal slowing,  He complains of frequent brain pounding,headache, heart racing,  could not sleep well, also under a lot of stress  Differentiation diagnosis include possible partial seizure  Will start Depakote ER 500 mg once every night, also as headache prevention, mood stabilizer  Meds ordered this encounter  Medications   divalproex (DEPAKOTE ER) 500 MG 24 hr tablet    Sig: Take 1 tablet (500 mg total) by mouth at bedtime.    Dispense:  30 tablet    Refill:  11       This is a abnormal 3-day ambulatory EEG due to presence of intermittent left frontal slowing with sharply contoured waves. This is consistent with an area of neuronal dysfunction in the left frontal region. There were a total of 16 events as described above with no changes in EEG background. Clinical correlation recommended.   Please see the MyChart message reply(ies) for my assessment and plan.    This patient gave consent for this Medical Advice Message and is aware that it may result in a bill to [employer], as well as the possibility of receiving a bill for a co-payment or deductible. They are an established patient, but are not seeking medical advice exclusively about a problem treated during an in person or video visit in the last seven days. I did not recommend an in person or video visit within seven days of my reply.    I spent a total of 12 minutes cumulative time within 7 days through [employer].  Phebe Brasil, MD
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
## 2023-09-13 ENCOUNTER — Other Ambulatory Visit: Payer: Self-pay | Admitting: Neurology

## 2023-09-20 NOTE — Telephone Encounter (Signed)
 I called patient, amitriptyline  25 mg 2 tablets every night to help his sleep and migraine,  I have added on Depakote  to ER 500 mg every night for his mild abnormal ambulatory EEG, suggest him taking Depakote  at evening time

## 2023-10-17 ENCOUNTER — Other Ambulatory Visit: Payer: Self-pay | Admitting: Neurology

## 2023-11-09 ENCOUNTER — Encounter: Payer: Self-pay | Admitting: Neurology

## 2023-11-10 MED ORDER — AMITRIPTYLINE HCL 25 MG PO TABS: 50.0000 mg | ORAL_TABLET | Freq: Every day | ORAL | 1 refills | Status: DC

## 2023-11-16 ENCOUNTER — Encounter: Payer: Self-pay | Admitting: Neurology

## 2023-11-16 ENCOUNTER — Ambulatory Visit (INDEPENDENT_AMBULATORY_CARE_PROVIDER_SITE_OTHER): Payer: Self-pay | Admitting: Neurology

## 2023-11-16 DIAGNOSIS — S060X9D Concussion with loss of consciousness of unspecified duration, subsequent encounter: Secondary | ICD-10-CM

## 2023-11-16 DIAGNOSIS — R42 Dizziness and giddiness: Secondary | ICD-10-CM

## 2023-11-16 DIAGNOSIS — G43709 Chronic migraine without aura, not intractable, without status migrainosus: Secondary | ICD-10-CM

## 2023-11-16 NOTE — Patient Instructions (Signed)
 Check EEG Check labs  Referral to concussion clinic  Drink plenty of water, standing slowly Follow up in 4-6 months with Dr. Onita
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
## 2023-11-17 ENCOUNTER — Ambulatory Visit: Payer: Self-pay | Admitting: Neurology

## 2023-11-17 LAB — CBC WITH DIFFERENTIAL/PLATELET
Basophils Absolute: 0 x10E3/uL (ref 0.0–0.2)
Basos: 1 %
EOS (ABSOLUTE): 0.1 x10E3/uL (ref 0.0–0.4)
Eos: 2 %
Hematocrit: 49.7 % (ref 37.5–51.0)
Hemoglobin: 16.5 g/dL (ref 13.0–17.7)
Immature Grans (Abs): 0 x10E3/uL (ref 0.0–0.1)
Immature Granulocytes: 0 %
Lymphocytes Absolute: 1.7 x10E3/uL (ref 0.7–3.1)
Lymphs: 27 %
MCH: 32.7 pg (ref 26.6–33.0)
MCHC: 33.2 g/dL (ref 31.5–35.7)
MCV: 98 fL — ABNORMAL HIGH (ref 79–97)
Monocytes Absolute: 0.5 x10E3/uL (ref 0.1–0.9)
Monocytes: 8 %
Neutrophils Absolute: 4 x10E3/uL (ref 1.4–7.0)
Neutrophils: 62 %
Platelets: 291 x10E3/uL (ref 150–450)
RBC: 5.05 x10E6/uL (ref 4.14–5.80)
RDW: 11.6 % (ref 11.6–15.4)
WBC: 6.4 x10E3/uL (ref 3.4–10.8)

## 2023-11-17 LAB — COMPREHENSIVE METABOLIC PANEL WITH GFR
ALT: 40 IU/L (ref 0–44)
AST: 24 IU/L (ref 0–40)
Albumin: 4.8 g/dL (ref 4.1–5.1)
Alkaline Phosphatase: 70 IU/L (ref 44–121)
BUN/Creatinine Ratio: 12 (ref 9–20)
BUN: 13 mg/dL (ref 6–24)
Bilirubin Total: 0.5 mg/dL (ref 0.0–1.2)
CO2: 23 mmol/L (ref 20–29)
Calcium: 9.7 mg/dL (ref 8.7–10.2)
Chloride: 102 mmol/L (ref 96–106)
Creatinine, Ser: 1.12 mg/dL (ref 0.76–1.27)
Globulin, Total: 2.7 g/dL (ref 1.5–4.5)
Glucose: 82 mg/dL (ref 70–99)
Potassium: 4.7 mmol/L (ref 3.5–5.2)
Sodium: 140 mmol/L (ref 134–144)
Total Protein: 7.5 g/dL (ref 6.0–8.5)
eGFR: 85 mL/min/1.73 (ref >59)

## 2023-11-17 LAB — VALPROIC ACID LEVEL: Valproic Acid Lvl: 18 ug/mL — ABNORMAL LOW (ref 50–100)

## 2023-11-24 ENCOUNTER — Telehealth: Payer: Self-pay | Admitting: Neurology

## 2023-11-24 NOTE — Telephone Encounter (Signed)
 LVM and sent mychart msg informing pt of appt change due to [place] being out

## 2023-12-07 ENCOUNTER — Other Ambulatory Visit: Payer: Self-pay | Admitting: *Deleted

## 2023-12-08 ENCOUNTER — Ambulatory Visit (INDEPENDENT_AMBULATORY_CARE_PROVIDER_SITE_OTHER): Payer: Self-pay | Admitting: Neurology

## 2023-12-08 DIAGNOSIS — G43709 Chronic migraine without aura, not intractable, without status migrainosus: Secondary | ICD-10-CM

## 2023-12-08 DIAGNOSIS — R4182 Altered mental status, unspecified: Secondary | ICD-10-CM

## 2023-12-08 DIAGNOSIS — R42 Dizziness and giddiness: Secondary | ICD-10-CM
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
## 2023-12-12 ENCOUNTER — Other Ambulatory Visit: Payer: Self-pay | Admitting: Sports Medicine

## 2023-12-12 ENCOUNTER — Other Ambulatory Visit: Payer: Self-pay

## 2023-12-12 ENCOUNTER — Ambulatory Visit (INDEPENDENT_AMBULATORY_CARE_PROVIDER_SITE_OTHER): Payer: Self-pay | Admitting: Sports Medicine

## 2023-12-12 VITALS — BP 132/84 | HR 78 | Ht 69.0 in | Wt 185.0 lb

## 2023-12-12 DIAGNOSIS — R42 Dizziness and giddiness: Secondary | ICD-10-CM

## 2023-12-12 DIAGNOSIS — G43709 Chronic migraine without aura, not intractable, without status migrainosus: Secondary | ICD-10-CM | POA: Diagnosis not present

## 2023-12-12 DIAGNOSIS — R453 Demoralization and apathy: Secondary | ICD-10-CM

## 2023-12-12 DIAGNOSIS — F8081 Childhood onset fluency disorder: Secondary | ICD-10-CM

## 2023-12-12 DIAGNOSIS — R569 Unspecified convulsions: Secondary | ICD-10-CM | POA: Diagnosis not present

## 2023-12-12 DIAGNOSIS — S060X0A Concussion without loss of consciousness, initial encounter: Secondary | ICD-10-CM

## 2023-12-12 DIAGNOSIS — R413 Other amnesia: Secondary | ICD-10-CM

## 2023-12-12 NOTE — Patient Instructions (Addendum)
 Behavior health therapy referral  Speech therapy referral  Vestibular therapy referral   MRI referral [facility] in [place]   -           Goal of sleeping a minimum of 7-8 continuous hours nightly -Recommend light physical activity for 15-30 minutes a day while keeping symptoms less than 3/10 -Stop mental or physical activities that cause symptoms to worsen greater than 3/10, and wait 24 hours before attempting them again -Eliminate screen time as much as possible for first 48 hours after concussive event, then continue limited screen time (recommend less than 2 hours per day)  Follow up 5 days after MRI to discuss results

## 2023-12-12 NOTE — Addendum Note (Signed)
 Addended by: MAYBELL ALSTON R on: 12/12/2023 02:46 PM   Modules accepted: Orders

## 2023-12-13 ENCOUNTER — Telehealth: Payer: Self-pay | Admitting: Neurology

## 2023-12-13 ENCOUNTER — Encounter

## 2023-12-13 MED ORDER — DIVALPROEX SODIUM ER 250 MG PO TB24: 250.0000 mg | ORAL_TABLET | Freq: Every day | ORAL | 5 refills | Status: DC

## 2023-12-13 NOTE — Telephone Encounter (Signed)
 I called the patient, EEG slightly abnormal with mild slowing of the left frontal area suggesting focal irritability.  On Depakote  ER 500 mg daily.  Reports continued headache, not sleeping well.  States good benefit with Depakote  in regards to the spells of feeling off, we will do a trial of slightly higher dosing add on Depakote  ER 250 to equal total of 750 mg daily.  Meds ordered this encounter  Medications   divalproex  (DEPAKOTE  ER) 250 MG 24 hr tablet    Sig: Take 1 tablet (250 mg total) by mouth at bedtime. Combine with the Depakote  500 mg for a total of 750 mg daily    Dispense:  30 tablet    Refill:  5

## 2023-12-19 ENCOUNTER — Ambulatory Visit
Admission: RE | Admit: 2023-12-19 | Discharge: 2023-12-19 | Disposition: A | Discharge status: Home / Self Care | Payer: Worker's Compensation | Source: Ambulatory Visit | Attending: Sports Medicine | Admitting: Sports Medicine

## 2023-12-19 DIAGNOSIS — F8081 Childhood onset fluency disorder: Secondary | ICD-10-CM

## 2023-12-19 DIAGNOSIS — R569 Unspecified convulsions: Secondary | ICD-10-CM

## 2023-12-19 DIAGNOSIS — R42 Dizziness and giddiness: Secondary | ICD-10-CM

## 2023-12-19 DIAGNOSIS — R453 Demoralization and apathy: Secondary | ICD-10-CM

## 2023-12-19 DIAGNOSIS — G43709 Chronic migraine without aura, not intractable, without status migrainosus: Secondary | ICD-10-CM

## 2023-12-19 DIAGNOSIS — S060X0A Concussion without loss of consciousness, initial encounter: Secondary | ICD-10-CM

## 2023-12-19 DIAGNOSIS — R413 Other amnesia: Secondary | ICD-10-CM

## 2023-12-19 MED ORDER — GADOPICLENOL 0.5 MMOL/ML IV SOLN
10.0000 mL | Freq: Once | INTRAVENOUS | Status: AC | PRN
  Administered 2023-12-19 (×2): 8 mL via INTRAVENOUS

## 2023-12-27 ENCOUNTER — Ambulatory Visit: Payer: Self-pay | Admitting: Sports Medicine
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
## 2024-01-03 ENCOUNTER — Ambulatory Visit (INDEPENDENT_AMBULATORY_CARE_PROVIDER_SITE_OTHER): Payer: Self-pay | Admitting: Sports Medicine

## 2024-01-03 VITALS — HR 81 | Ht 69.0 in | Wt 185.0 lb

## 2024-01-03 DIAGNOSIS — R413 Other amnesia: Secondary | ICD-10-CM

## 2024-01-03 DIAGNOSIS — R569 Unspecified convulsions: Secondary | ICD-10-CM

## 2024-01-03 DIAGNOSIS — R453 Demoralization and apathy: Secondary | ICD-10-CM

## 2024-01-03 DIAGNOSIS — F8081 Childhood onset fluency disorder: Secondary | ICD-10-CM

## 2024-01-03 DIAGNOSIS — G43709 Chronic migraine without aura, not intractable, without status migrainosus: Secondary | ICD-10-CM

## 2024-01-03 DIAGNOSIS — R42 Dizziness and giddiness: Secondary | ICD-10-CM

## 2024-01-03 DIAGNOSIS — S069X0D Unspecified intracranial injury without loss of consciousness, subsequent encounter: Secondary | ICD-10-CM

## 2024-01-03 MED ORDER — IBUPROFEN 200 MG PO TABS: 200.0000 mg | ORAL_TABLET | Freq: Three times a day (TID) | ORAL | 3 refills | Status: AC

## 2024-01-03 NOTE — Patient Instructions (Signed)
 Ibuprofen  200 mg 3x daily as needed  Work note provided can return to work as tolerated rest breaks eery hour as needed 3 month   Recommend neurology take over future recommendations  As needed follow up   Recommend started speech and vestibular therapy

## 2024-01-31 ENCOUNTER — Other Ambulatory Visit: Payer: Self-pay

## 2024-01-31 MED ORDER — DIVALPROEX SODIUM ER 250 MG PO TB24: 250.0000 mg | ORAL_TABLET | Freq: Every day | ORAL | 5 refills | Status: DC

## 2024-01-31 NOTE — Telephone Encounter (Signed)
 The last note didn't say to continue the 250 for total of 750. Can you clarify? Thanks,  Production assistant, radio

## 2024-02-09 ENCOUNTER — Telehealth: Payer: Self-pay | Admitting: Pharmacist

## 2024-02-09 ENCOUNTER — Other Ambulatory Visit (HOSPITAL_COMMUNITY): Payer: Self-pay

## 2024-02-09 NOTE — Telephone Encounter (Signed)
 Pharmacy Patient Advocate Encounter  Insurance verification completed.   The patient is insured through [employer] test claim for SUMAtriptan  Succinate 100MG  tablets. Currently a quantity of 9 is a 30 day supply and the co-pay is $5.21 . The current 30 day co-pay is, $5.21.  No PA needed at this time.  This test claim was processed through [facility]- copay amounts may vary at other pharmacies due to pharmacy/plan contracts, or as the patient moves through the different stages of their insurance plan.  Spoke with pharmacy, this is a worker's compensation issue-they have reached out directly to them.  PA not needed.

## 2024-02-14 ENCOUNTER — Other Ambulatory Visit: Payer: Self-pay

## 2024-02-14 MED ORDER — AMITRIPTYLINE HCL 25 MG PO TABS: 50.0000 mg | ORAL_TABLET | Freq: Every day | ORAL | 3 refills | Status: DC

## 2024-03-02 ENCOUNTER — Encounter: Payer: Self-pay | Admitting: Neurology

## 2024-03-05 MED ORDER — SUMATRIPTAN SUCCINATE 100 MG PO TABS: ORAL_TABLET | ORAL | 3 refills | Status: AC

## 2024-03-05 MED ORDER — DIVALPROEX SODIUM ER 500 MG PO TB24: 500.0000 mg | ORAL_TABLET | Freq: Every day | ORAL | 3 refills | Status: AC

## 2024-03-05 MED ORDER — AMITRIPTYLINE HCL 25 MG PO TABS: 50.0000 mg | ORAL_TABLET | Freq: Every day | ORAL | 3 refills | Status: DC

## 2024-03-05 MED ORDER — DIVALPROEX SODIUM ER 250 MG PO TB24
250.0000 mg | ORAL_TABLET | Freq: Every day | ORAL | 3 refills | Status: AC
Start: 2024-03-05 — End: ?

## 2024-04-14 ENCOUNTER — Other Ambulatory Visit: Payer: Self-pay | Admitting: Neurology

## 2024-04-19 ENCOUNTER — Ambulatory Visit: Payer: Self-pay | Admitting: Neurology

## 2024-04-19 ENCOUNTER — Encounter: Payer: Self-pay | Admitting: Neurology

## 2024-04-19 VITALS — BP 129/79 | HR 61 | Ht 69.0 in | Wt 187.0 lb

## 2024-04-19 DIAGNOSIS — R5383 Other fatigue: Secondary | ICD-10-CM | POA: Insufficient documentation

## 2024-04-19 DIAGNOSIS — R42 Dizziness and giddiness: Secondary | ICD-10-CM

## 2024-04-19 DIAGNOSIS — G43709 Chronic migraine without aura, not intractable, without status migrainosus: Secondary | ICD-10-CM
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
## 2024-04-22 LAB — COMPREHENSIVE METABOLIC PANEL WITH GFR
ALT: 33 IU/L (ref 0–44)
AST: 22 IU/L (ref 0–40)
Albumin: 4.5 g/dL (ref 4.1–5.1)
Alkaline Phosphatase: 56 IU/L (ref 47–123)
BUN/Creatinine Ratio: 13 (ref 9–20)
BUN: 14 mg/dL (ref 6–24)
Bilirubin Total: 0.3 mg/dL (ref 0.0–1.2)
CO2: 24 mmol/L (ref 20–29)
Calcium: 9.4 mg/dL (ref 8.7–10.2)
Chloride: 102 mmol/L (ref 96–106)
Creatinine, Ser: 1.06 mg/dL (ref 0.76–1.27)
Globulin, Total: 2.6 g/dL (ref 1.5–4.5)
Glucose: 75 mg/dL (ref 70–99)
Potassium: 4.4 mmol/L (ref 3.5–5.2)
Sodium: 142 mmol/L (ref 134–144)
Total Protein: 7.1 g/dL (ref 6.0–8.5)
eGFR: 90 mL/min/1.73 (ref >59)

## 2024-04-22 LAB — CBC WITH DIFFERENTIAL/PLATELET
Basophils Absolute: 0 x10E3/uL (ref 0.0–0.2)
Basos: 1 %
EOS (ABSOLUTE): 0.1 x10E3/uL (ref 0.0–0.4)
Eos: 3 %
Hematocrit: 46.7 % (ref 37.5–51.0)
Hemoglobin: 15.3 g/dL (ref 13.0–17.7)
Immature Grans (Abs): 0 x10E3/uL (ref 0.0–0.1)
Immature Granulocytes: 0 %
Lymphocytes Absolute: 1.7 x10E3/uL (ref 0.7–3.1)
Lymphs: 34 %
MCH: 31.8 pg (ref 26.6–33.0)
MCHC: 32.8 g/dL (ref 31.5–35.7)
MCV: 97 fL (ref 79–97)
Monocytes Absolute: 0.4 x10E3/uL (ref 0.1–0.9)
Monocytes: 9 %
Neutrophils Absolute: 2.7 x10E3/uL (ref 1.4–7.0)
Neutrophils: 53 %
Platelets: 243 x10E3/uL (ref 150–450)
RBC: 4.81 x10E6/uL (ref 4.14–5.80)
RDW: 11.5 % — ABNORMAL LOW (ref 11.6–15.4)
WBC: 5 x10E3/uL (ref 3.4–10.8)

## 2024-04-22 LAB — ACETYLCHOLINE RECEPTOR, BINDING: AChR Binding Ab, Serum: <0.07 nmol/L (ref 0.00–0.24)

## 2024-04-23 ENCOUNTER — Ambulatory Visit: Payer: Self-pay | Admitting: Neurology

## 2024-04-25 ENCOUNTER — Encounter: Payer: Self-pay | Admitting: Family Medicine

## 2024-05-18 ENCOUNTER — Other Ambulatory Visit: Payer: Self-pay | Admitting: Neurology

## 2024-05-23 ENCOUNTER — Encounter: Payer: Self-pay | Admitting: Neurology

## 2024-05-23 DIAGNOSIS — S060X9D Concussion with loss of consciousness of unspecified duration, subsequent encounter: Secondary | ICD-10-CM

## 2024-05-23 DIAGNOSIS — G43709 Chronic migraine without aura, not intractable, without status migrainosus: Secondary | ICD-10-CM

## 2024-05-23 DIAGNOSIS — R5383 Other fatigue: Secondary | ICD-10-CM

## 2024-05-23 NOTE — Telephone Encounter (Signed)
Are you okay with placing referral

## 2024-05-25 ENCOUNTER — Telehealth: Payer: Self-pay | Admitting: Neurology

## 2024-05-25 NOTE — Telephone Encounter (Signed)
 Referral to Neurology faxed to [facility]   [facility]   Phone: [phone] Fax: [phone]

## 2024-08-22 ENCOUNTER — Encounter: Admitting: Family Medicine

## 2025-04-11 ENCOUNTER — Ambulatory Visit: Payer: Self-pay | Admitting: Neurology
